# Patient Record
Sex: Female | Born: 1965 | Race: White | Hispanic: No | Marital: Married | Smoking: Former smoker
Health system: Southern US, Community
[De-identification: ages and names within clinical notes are randomized; demographics above are authoritative.]

## PROBLEM LIST (undated history)

## (undated) DIAGNOSIS — G2581 Restless legs syndrome: Secondary | ICD-10-CM

## (undated) DIAGNOSIS — N390 Urinary tract infection, site not specified: Secondary | ICD-10-CM

## (undated) DIAGNOSIS — F32A Depression, unspecified: Secondary | ICD-10-CM

## (undated) DIAGNOSIS — F329 Major depressive disorder, single episode, unspecified: Secondary | ICD-10-CM

## (undated) DIAGNOSIS — F419 Anxiety disorder, unspecified: Secondary | ICD-10-CM

## (undated) HISTORY — PX: LIPOSUCTION: SHX10

## (undated) HISTORY — DX: Urinary tract infection, site not specified: N39.0

---

## 1995-02-02 HISTORY — PX: TUBAL LIGATION: SHX77

## 2008-05-29 ENCOUNTER — Ambulatory Visit: Payer: Self-pay | Admitting: Gastroenterology

## 2008-09-25 ENCOUNTER — Emergency Department: Payer: Self-pay | Admitting: Unknown Physician Specialty

## 2010-03-19 ENCOUNTER — Ambulatory Visit: Payer: Self-pay | Admitting: General Surgery

## 2012-06-21 ENCOUNTER — Emergency Department: Payer: Self-pay | Admitting: Emergency Medicine

## 2013-09-17 ENCOUNTER — Emergency Department (HOSPITAL_COMMUNITY)
Admission: EM | Admit: 2013-09-17 | Discharge: 2013-09-17 | Disposition: A | Discharge status: Home / Self Care | Payer: 59 | Attending: Emergency Medicine | Admitting: Emergency Medicine

## 2013-09-17 ENCOUNTER — Encounter (HOSPITAL_COMMUNITY): Payer: Self-pay | Admitting: Emergency Medicine

## 2013-09-17 ENCOUNTER — Emergency Department (HOSPITAL_COMMUNITY): Payer: 59

## 2013-09-17 DIAGNOSIS — Z79899 Other long term (current) drug therapy: Secondary | ICD-10-CM | POA: Diagnosis not present

## 2013-09-17 DIAGNOSIS — F411 Generalized anxiety disorder: Secondary | ICD-10-CM | POA: Diagnosis not present

## 2013-09-17 DIAGNOSIS — R072 Precordial pain: Secondary | ICD-10-CM | POA: Diagnosis not present

## 2013-09-17 DIAGNOSIS — R110 Nausea: Secondary | ICD-10-CM | POA: Insufficient documentation

## 2013-09-17 DIAGNOSIS — E876 Hypokalemia: Secondary | ICD-10-CM | POA: Diagnosis not present

## 2013-09-17 DIAGNOSIS — R51 Headache: Secondary | ICD-10-CM | POA: Insufficient documentation

## 2013-09-17 DIAGNOSIS — R079 Chest pain, unspecified: Secondary | ICD-10-CM | POA: Diagnosis present

## 2013-09-17 DIAGNOSIS — G2581 Restless legs syndrome: Secondary | ICD-10-CM | POA: Diagnosis not present

## 2013-09-17 DIAGNOSIS — F329 Major depressive disorder, single episode, unspecified: Secondary | ICD-10-CM | POA: Insufficient documentation

## 2013-09-17 DIAGNOSIS — F3289 Other specified depressive episodes: Secondary | ICD-10-CM | POA: Insufficient documentation

## 2013-09-17 DIAGNOSIS — M546 Pain in thoracic spine: Secondary | ICD-10-CM | POA: Insufficient documentation

## 2013-09-17 HISTORY — DX: Depression, unspecified: F32.A

## 2013-09-17 HISTORY — DX: Major depressive disorder, single episode, unspecified: F32.9

## 2013-09-17 HISTORY — DX: Restless legs syndrome: G25.81

## 2013-09-17 HISTORY — DX: Anxiety disorder, unspecified: F41.9

## 2013-09-17 LAB — CBC WITH DIFFERENTIAL/PLATELET
Basophils Absolute: 0.1 10*3/uL (ref 0.0–0.1)
Basophils Relative: 1 % (ref 0–1)
EOS ABS: 0.3 10*3/uL (ref 0.0–0.7)
EOS PCT: 4 % (ref 0–5)
HEMATOCRIT: 36.1 % (ref 36.0–46.0)
Hemoglobin: 12.7 g/dL (ref 12.0–15.0)
LYMPHS ABS: 3.2 10*3/uL (ref 0.7–4.0)
LYMPHS PCT: 45 % (ref 12–46)
MCH: 30.9 pg (ref 26.0–34.0)
MCHC: 35.2 g/dL (ref 30.0–36.0)
MCV: 87.8 fL (ref 78.0–100.0)
MONO ABS: 0.4 10*3/uL (ref 0.1–1.0)
Monocytes Relative: 5 % (ref 3–12)
Neutro Abs: 3.3 10*3/uL (ref 1.7–7.7)
Neutrophils Relative %: 45 % (ref 43–77)
PLATELETS: 260 10*3/uL (ref 150–400)
RBC: 4.11 MIL/uL (ref 3.87–5.11)
RDW: 12.5 % (ref 11.5–15.5)
WBC: 7.2 10*3/uL (ref 4.0–10.5)

## 2013-09-17 LAB — BASIC METABOLIC PANEL
Anion gap: 13 (ref 5–15)
BUN: 13 mg/dL (ref 6–23)
CALCIUM: 9 mg/dL (ref 8.4–10.5)
CO2: 22 meq/L (ref 19–32)
Chloride: 103 mEq/L (ref 96–112)
Creatinine, Ser: 0.74 mg/dL (ref 0.50–1.10)
GFR calc Af Amer: >90 mL/min (ref >90)
GFR calc non Af Amer: >90 mL/min (ref >90)
GLUCOSE: 91 mg/dL (ref 70–99)
Potassium: 3.5 mEq/L — ABNORMAL LOW (ref 3.7–5.3)
Sodium: 138 mEq/L (ref 137–147)

## 2013-09-17 LAB — LIPASE, BLOOD: LIPASE: 26 U/L (ref 11–59)

## 2013-09-17 LAB — D-DIMER, QUANTITATIVE (NOT AT ARMC): D DIMER QUANT: 0.31 ug{FEU}/mL (ref 0.00–0.48)

## 2013-09-17 LAB — I-STAT TROPONIN, ED: Troponin i, poc: 0 ng/mL (ref 0.00–0.08)

## 2013-09-17 LAB — TROPONIN I: Troponin I: <0.3 ng/mL (ref <0.30)

## 2013-09-17 MED ORDER — MIRTAZAPINE 45 MG PO TABS
45.0000 mg | ORAL_TABLET | Freq: Every day | ORAL | Status: DC
  Administered 2013-09-17: 45 mg via ORAL
  Filled 2013-09-17: qty 1

## 2013-09-17 MED ORDER — GI COCKTAIL ~~LOC~~
30.0000 mL | Freq: Once | ORAL | Status: AC
  Administered 2013-09-17: 30 mL via ORAL
  Filled 2013-09-17: qty 30

## 2013-09-17 MED ORDER — POTASSIUM CHLORIDE CRYS ER 20 MEQ PO TBCR
40.0000 meq | EXTENDED_RELEASE_TABLET | Freq: Once | ORAL | Status: AC
  Administered 2013-09-17: 40 meq via ORAL
  Filled 2013-09-17: qty 2

## 2013-09-17 MED ORDER — IBUPROFEN 800 MG PO TABS
800.0000 mg | ORAL_TABLET | Freq: Once | ORAL | Status: AC
  Administered 2013-09-17: 800 mg via ORAL
  Filled 2013-09-17: qty 1

## 2013-09-17 NOTE — ED Notes (Signed)
Walked into patient room and patient had taken off EKG monitor, BP, and pulse ox and got dressed into her own clothes.

## 2013-09-17 NOTE — ED Notes (Addendum)
Pt. Has hematoma on left hand from EMS IV stick. Ice pack applied.

## 2013-09-17 NOTE — ED Provider Notes (Signed)
CSN: [id number]     Arrival date & time 09/17/13  1524 History   First MD Initiated Contact with Patient 09/17/13 1534     Chief Complaint  Patient presents with  . Chest Pain   Patient is a 48 y.o. female presenting with chest pain. The history is provided by the patient. No language interpreter was used.  Chest Pain Pain location:  Substernal area Pain quality: pressure   Pain radiates to:  Mid back Pain radiates to the back: yes   Pain severity:  Moderate Onset quality:  Sudden Duration:  30 minutes Timing:  Constant (followed by intermittent pressure for seconds which was not as bad as the initial pain) Progression:  Partially resolved Chronicity:  New Context: at rest and stress   Context: not breathing, no drug use, not eating, not lifting, no movement, not raising an arm and no trauma   Relieved by:  Aspirin and nitroglycerin Worsened by:  Nothing tried Ineffective treatments:  None tried Associated symptoms: anxiety, back pain, fatigue, headache and nausea   Associated symptoms: no abdominal pain, no altered mental status, no anorexia, no claudication, no cough, no diaphoresis, no dizziness, no dysphagia, no fever, no heartburn, no lower extremity edema, no near-syncope, no numbness, no orthopnea, no palpitations, no PND, no shortness of breath, no syncope, not vomiting and no weakness   Risk factors: no aortic disease, no birth control, no coronary artery disease, no diabetes mellitus, no high cholesterol, no hypertension, no immobilization, not obese, not pregnant, no prior DVT/PE, no smoking and no surgery     Past Medical History  Diagnosis Date  . Anxiety   . Depression   . Restless leg syndrome    Past Surgical History  Procedure Laterality Date  . Cesarean section    . Tubal ligation     No family history on file. History  Substance Use Topics  . Smoking status: Never Smoker   . Smokeless tobacco: Not on file  . Alcohol Use: Yes     Comment: occasionally    OB History   Grav Para Term Preterm Abortions TAB SAB Ect Mult Living                 Review of Systems  Constitutional: Positive for fatigue. Negative for fever and diaphoresis.  HENT: Negative for trouble swallowing.   Respiratory: Negative for cough and shortness of breath.   Cardiovascular: Positive for chest pain. Negative for palpitations, orthopnea, claudication, syncope, PND and near-syncope.  Gastrointestinal: Positive for nausea. Negative for heartburn, vomiting, abdominal pain and anorexia.  Musculoskeletal: Positive for back pain.  Neurological: Positive for headaches. Negative for dizziness, weakness and numbness.  All other systems reviewed and are negative.     Allergies  Macrobid; Neomycin; and Polymyxin b  Home Medications   Prior to Admission medications   Medication Sig Start Date End Date Taking? Authorizing Provider  clonazePAM (KLONOPIN) 0.5 MG tablet Take 0.5 mg by mouth 3 (three) times daily as needed for anxiety.   Yes Historical Provider, MD  gabapentin (NEURONTIN) 300 MG capsule Take 300 mg by mouth 2 (two) times daily as needed (nerve pain).   Yes Historical Provider, MD  ibuprofen (ADVIL,MOTRIN) 200 MG tablet Take 200 mg by mouth daily as needed for headache.   Yes Historical Provider, MD  mirtazapine (REMERON) 45 MG tablet Take 45 mg by mouth at bedtime.   Yes Historical Provider, MD  OVER THE COUNTER MEDICATION Place 1 drop into both eyes daily. Allergy  eye drops   Yes Historical Provider, MD   BP 114/62  Pulse 90  Resp 25  SpO2 98%  LMP 08/24/2013 Physical Exam  Nursing note and vitals reviewed. Constitutional: She is oriented to person, place, and time. She appears well-developed and well-nourished. No distress.  HENT:  Head: Normocephalic and atraumatic.  Mouth/Throat: Oropharynx is clear and moist. No oropharyngeal exudate.  Eyes: Conjunctivae and EOM are normal. Pupils are equal, round, and reactive to light. No scleral icterus.   Neck: Normal range of motion. Neck supple. No JVD present. No thyromegaly present.  Cardiovascular: Normal rate, regular rhythm, normal heart sounds and intact distal pulses.  Exam reveals no gallop and no friction rub.   No murmur heard. Pulmonary/Chest: Effort normal and breath sounds normal. No respiratory distress. She has no wheezes. She has no rales. She exhibits no tenderness.  Abdominal: Soft. Bowel sounds are normal. She exhibits no distension and no mass. There is no tenderness. There is no rebound and no guarding.  Musculoskeletal: Normal range of motion.  Lymphadenopathy:    She has no cervical adenopathy.  Neurological: She is alert and oriented to person, place, and time. No cranial nerve deficit. Coordination normal.  Skin: Skin is warm and dry. She is not diaphoretic.  Psychiatric: She has a normal mood and affect. Her behavior is normal. Judgment and thought content normal.    ED Course  Procedures (including critical care time) Labs Review Labs Reviewed  BASIC METABOLIC PANEL - Abnormal; Notable for the following:    Potassium 3.5 (*)    All other components within normal limits  CBC WITH DIFFERENTIAL  D-DIMER, QUANTITATIVE  LIPASE, BLOOD  TROPONIN I  Rosezena Sensor, ED    Imaging Review Dg Chest 2 View  09/17/2013   CLINICAL DATA:  Chest pain for 1 day.  Initial encounter.  EXAM: CHEST  2 VIEW  COMPARISON:  None.  FINDINGS: Cardiopericardial silhouette within normal limits. Mediastinal contours normal. Trachea midline. No airspace disease or effusion.  IMPRESSION: No active cardiopulmonary disease.   Electronically Signed   By: Andreas Newport M.D.   On: 09/17/2013 18:54     EKG Interpretation   Date/Time:  Monday September 17 2013 15:42:27 EDT Ventricular Rate:  90 PR Interval:  124 QRS Duration: 81 QT Interval:  352 QTC Calculation: 431 R Axis:   77 Text Interpretation:  Age not entered, assumed to be  48 years old for  purpose of ECG interpretation  Sinus rhythm LAE, consider biatrial  enlargement No old tracing to compare Confirmed by KNAPP  MD-I, IVA  ([id number]) on 09/17/2013 3:52:52 PM      MDM   Final diagnoses:  Chest pain, unspecified chest pain type  Hypokalemia   Patient is a 48 y.o. Female with 1 episode of 30 minute chest pain which is new onset.  EKG here shows some LVH with possible biatrial enlargement.  We do not have any previous EKGs to compare to.  CBC, D-dimer, CXR, troponin and delta troponin are negative here at this time.  BMP shows mild hypokalemia which was replaced here in the ED.  Patient has a HEART score of 3 and is low risk.  Patient has been treated here with a GI cocktail with some minor relief of symptoms.  Patient is stable for discharge at this time.  Will have the patient follow-up closely with her PCP Dr. Dossie Arbour and we discussed the possible need for an outpatient stress test.  Patient was told to return  to the ED with worsening shortness of breath and ACS symptoms.  She states understanding and agreement.  Patient was seen by and discussed with Dr. Loretha Stapler who agrees with the above treatment and plan.     Eben Burow, PA-C 09/17/13 2049  Eben Burow, PA-C 09/18/13 0045

## 2013-09-17 NOTE — ED Notes (Signed)
[facility]. [facility] States is having intermittent chest pressure. States "It's not as bad as it was, but I can feel it".

## 2013-09-17 NOTE — ED Notes (Signed)
Pt. Ambulated with steady gait to restroom.  

## 2013-09-17 NOTE — ED Provider Notes (Signed)
Medical screening examination/treatment/procedure(s) were conducted as a shared visit with non-physician practitioner(s) and myself.  I personally evaluated the patient during the encounter.   EKG Interpretation   Date/Time:  Monday September 17 2013 15:42:27 EDT Ventricular Rate:  90 PR Interval:  124 QRS Duration: 81 QT Interval:  352 QTC Calculation: 431 R Axis:   77 Text Interpretation:  Age not entered, assumed to be  48 years old for  purpose of ECG interpretation Sinus rhythm LAE, consider biatrial  enlargement No old tracing to compare Confirmed by KNAPP  MD-I, IVA  ([id number]) on 09/17/2013 3:52:52 PM      48 yo female with CP.  On exam, well appearing, nontoxic, not distressed, normal respiratory effort, normal perfusion, heart sounds normal with RRR, lungs CTAB, pulses 2+ and equal in BUE, BPs consistent in BUE.  Labwork, CXR, EKG normal.  Treated for GI chest pain.    Clinical Impression: 1. Chest pain, unspecified chest pain type   2. Hypokalemia       Candyce ChurnJohn David Dolph Tavano III, MD 09/18/13 [phone]

## 2013-09-17 NOTE — ED Notes (Signed)
Pt. Alert and oriented x4. Remeron to be at bedtime. Pt. Denies chest pain. Stable upon discharge. Denies further needs.

## 2013-09-17 NOTE — Discharge Instructions (Signed)

## 2013-09-17 NOTE — ED Notes (Signed)
Per EMS, patient c/o chest pain while at rest at work,  Radiation to left arm and back. Given 324 ASA, 4 mg zofran, and 1 nitro which relieved pain. Hx of anxiety, patient tearful on arrival. Family hx of early MI, pt mother had CABG at age 49.

## 2013-09-18 NOTE — ED Provider Notes (Signed)
Medical screening examination/treatment/procedure(s) were conducted as a shared visit with non-physician practitioner(s) and myself.  I personally evaluated the patient during the encounter.   EKG Interpretation   Date/Time:  Monday September 17 2013 15:42:27 EDT Ventricular Rate:  90 PR Interval:  124 QRS Duration: 81 QT Interval:  352 QTC Calculation: 431 R Axis:   77 Text Interpretation:  Age not entered, assumed to be  48 years old for  purpose of ECG interpretation Sinus rhythm LAE, consider biatrial  enlargement No old tracing to compare Confirmed by KNAPP  MD-I, IVA  ([id number]) on 09/17/2013 3:52:52 PM        [name], MD 09/18/13 [phone]

## 2014-04-18 ENCOUNTER — Other Ambulatory Visit: Payer: Self-pay | Admitting: Obstetrics & Gynecology

## 2014-04-18 DIAGNOSIS — R922 Inconclusive mammogram: Secondary | ICD-10-CM

## 2014-04-18 DIAGNOSIS — N6019 Diffuse cystic mastopathy of unspecified breast: Secondary | ICD-10-CM

## 2014-07-04 ENCOUNTER — Other Ambulatory Visit: Payer: Self-pay | Admitting: Family Medicine

## 2014-07-04 ENCOUNTER — Telehealth: Payer: Self-pay

## 2014-07-04 MED ORDER — GABAPENTIN 300 MG PO CAPS: 300.0000 mg | ORAL_CAPSULE | Freq: Two times a day (BID) | ORAL | Status: DC

## 2014-07-04 MED ORDER — MIRTAZAPINE 45 MG PO TABS: 45.0000 mg | ORAL_TABLET | Freq: Every day | ORAL | Status: DC

## 2014-07-04 NOTE — Telephone Encounter (Signed)
Notes from practice partner reviewed. I will continue her medication for 1 month- but we do need to get the records from the psychiatrist, which we haven't gotten yet.

## 2014-07-04 NOTE — Telephone Encounter (Signed)
No answer, will try to call patient again.

## 2014-07-04 NOTE — Telephone Encounter (Signed)
Patient called, she would like a refill on her medications that she gets from psych. She states that he (psych) does not help her with her conditions all he does is prescribe medications, she does not want to see him anymore. She states that she will continue to see her therapist.

## 2014-07-05 ENCOUNTER — Other Ambulatory Visit: Payer: Self-pay | Admitting: Family Medicine

## 2014-07-05 MED ORDER — TRAZODONE HCL 50 MG PO TABS: 50.0000 mg | ORAL_TABLET | Freq: Every day | ORAL | Status: DC

## 2014-07-05 NOTE — Telephone Encounter (Signed)
Discussed with patient that I will not give her that much klonopin. That if she needs that much, she would need to be under the care of a psychiatrist and that I don't prescribe that medicine without a preventative medicine. She states that she is taking her trazodone and needs a rx for that. Rx for mirtazapine, gabapentin and trazodone sent to her pharmacy today. Discussed with patient that I would be happy to wean her off the klonopin, or get her a referral to a new psychiatrist, but that I will not be able to continue giving her TID klonopin.

## 2014-07-05 NOTE — Telephone Encounter (Signed)
Patient called, notified her of Dr.Johnson's message. [facility] [address] [facility] [address], and [facility]. She states that she needs her Klonopin, mirtazapine, and trazodone. I let her speak with Dr. Laural BenesJohnson, she had several questions.

## 2014-07-11 ENCOUNTER — Telehealth: Payer: Self-pay | Admitting: Psychiatry

## 2014-07-11 NOTE — Telephone Encounter (Signed)
Received a call from Anda Latina from [facility] on 07/10/14. She indicated she was the medical student caring for the patient. I informed her that I would need a release of information before I could discuss any issues about patient. We received a fax signed release of information. I contacted Miss Dangler today. She informed me that the patient was admitted there for suicidal ideation in the context of cocaine use. I provided the list of medications and changes that the patient received under my care. According to Ms. Dangler patient informed her treatment team that she thought she had depression but was not clear whether she had been formally diagnosed by anyone. I did indicate that I had diagnosed the patient with depression.

## 2014-07-12 ENCOUNTER — Ambulatory Visit: Payer: Self-pay | Admitting: Psychiatry

## 2014-07-15 ENCOUNTER — Other Ambulatory Visit: Payer: Self-pay | Admitting: Family Medicine

## 2014-07-19 ENCOUNTER — Telehealth: Payer: Self-pay | Admitting: Family Medicine

## 2014-07-19 ENCOUNTER — Encounter: Payer: Self-pay | Admitting: Family Medicine

## 2014-07-19 ENCOUNTER — Ambulatory Visit (INDEPENDENT_AMBULATORY_CARE_PROVIDER_SITE_OTHER): Payer: 59 | Admitting: Family Medicine

## 2014-07-19 VITALS — BP 93/60 | HR 91 | Temp 97.6°F | Resp 18 | Ht 61.5 in | Wt 106.1 lb

## 2014-07-19 DIAGNOSIS — F411 Generalized anxiety disorder: Secondary | ICD-10-CM | POA: Insufficient documentation

## 2014-07-19 DIAGNOSIS — R45851 Suicidal ideations: Secondary | ICD-10-CM | POA: Diagnosis not present

## 2014-07-19 DIAGNOSIS — F1410 Cocaine abuse, uncomplicated: Secondary | ICD-10-CM | POA: Diagnosis not present

## 2014-07-19 DIAGNOSIS — F332 Major depressive disorder, recurrent severe without psychotic features: Secondary | ICD-10-CM

## 2014-07-19 NOTE — Patient Instructions (Signed)
Stimulant Use Disorder-Cocaine °Cocaine is one of a group of powerful drugs called stimulants. Cocaine has medical uses for stopping nosebleeds and for pain control before minor nose or dental surgery. However, cocaine is misused because of the effects that it produces. These effects include:  °· A feeling of extreme pleasure. °· Alertness. °· High energy. °Common street names for cocaine include coke, crack, blow, snow, and nose candy. Cocaine is snorted, dissolved in water and injected, or smoked.  °Stimulants are addictive because they activate regions of the brain that produce both the pleasurable sensation of "reward" and psychological dependence. Together, these actions account for loss of control and the rapid development of drug dependence. This means you become ill without the drug (withdrawal) and need to keep using it to function.  °Stimulant use disorder is use of stimulants that disrupts your daily life. It disrupts relationships with family and friends and how you do your job. Cocaine increases your blood pressure and heart rate. It can cause a heart attack or stroke. Cocaine can also cause death from irregular heart rate or seizures. °SYMPTOMS °Symptoms of stimulant use disorder with cocaine include: °· Use of cocaine in larger amounts or over a longer period of time than intended. °· Unsuccessful attempts to cut down or control cocaine use. °· A lot of time spent obtaining, using, or recovering from the effects of cocaine. °· A strong desire or urge to use cocaine (craving). °· Continued use of cocaine in spite of major problems at work, school, or home because of use. °· Continued use of cocaine in spite of relationship problems because of use. °· Giving up or cutting down on important life activities because of cocaine use. °· Use of cocaine over and over in situations when it is physically hazardous, such as driving a car. °· Continued use of cocaine in spite of a physical problem that is likely  related to use. Physical problems can include: °¨ Malnutrition. °¨ Nosebleeds. °¨ Chest pain. °¨ High blood pressure. °¨ A hole that develops between the part of your nose that separates your nostrils (perforated nasal septum). °¨ Lung and kidney damage. °· Continued use of cocaine in spite of a mental problem that is likely related to use. Mental problems can include: °¨ Schizophrenia-like symptoms. °¨ Depression. °¨ Bipolar mood swings. °¨ Anxiety. °¨ Sleep problems. °· Need to use more and more cocaine to get the same effect, or lessened effect over time with use of the same amount of cocaine (tolerance). °· Having withdrawal symptoms when cocaine use is stopped, or using cocaine to reduce or avoid withdrawal symptoms. Withdrawal symptoms include: °¨ Depressed or irritable mood. °¨ Low energy or restlessness. °¨ Bad dreams. °¨ Poor or excessive sleep. °¨ Increased appetite. °DIAGNOSIS °Stimulant use disorder is diagnosed by your health care provider. You may be asked questions about your cocaine use and how it affects your life. A physical exam may be done. A drug screen may be ordered. You may be referred to a mental health professional. The diagnosis of stimulant use disorder requires at least two symptoms within 12 months. The type of stimulant use disorder depends on the number of signs and symptoms you have. The type may be: °· Mild. Two or three signs and symptoms. °· Moderate. Four or five signs and symptoms. °· Severe. Six or more signs and symptoms. °TREATMENT °Treatment for stimulant use disorder is usually provided by mental health professionals with training in substance use disorders. The following options are available: °·   Counseling or talk therapy. Talk therapy addresses the reasons you use cocaine and ways to keep you from using again. Goals of talk therapy include: °¨ Identifying and avoiding triggers for use. °¨ Handling cravings. °¨ Replacing use with healthy activities. °· Support groups.  Support groups provide emotional support, advice, and guidance. °· Medicine. Certain medicines may decrease cocaine cravings or withdrawal symptoms. °HOME CARE INSTRUCTIONS °· Take medicines only as directed by your health care provider. °· Identify the people and activities that trigger your cocaine use and avoid them. °· Keep all follow-up visits as directed by your health care provider. °SEEK MEDICAL CARE IF: °· Your symptoms get worse or you relapse. °· You are not able to take medicines as directed. °SEEK IMMEDIATE MEDICAL CARE IF: °· You have serious thoughts about hurting yourself or others. °· You have a seizure, chest pain, sudden weakness, or loss of speech or vision. °FOR MORE INFORMATION °· National Institute on Drug Abuse: www.drugabuse.gov °· Substance Abuse and Mental Health Services Administration: www.samhsa.gov °Document Released: 01/16/2000 Document Revised: 06/04/2013 Document Reviewed: 01/31/2013 °ExitCare® Patient Information ©2015 [employer]. This information is not intended to replace advice given to you by your health care provider. Make sure you discuss any questions you have with your health care provider. ° °

## 2014-07-19 NOTE — Telephone Encounter (Signed)
Called to let patient know that she can pick up her paperwork for temporary disability. It is waiting for her in the front office.

## 2014-07-19 NOTE — Progress Notes (Signed)
BP 93/60 mmHg  Pulse 91  Temp(Src) 97.6 F (36.4 C)  Resp 18  Ht 5' 1.5" (1.562 m)  Wt 106 lb 1.6 oz (48.127 kg)  BMI 19.73 kg/m2  SpO2 97%  LMP 06/23/2014 (Exact Date)   Subjective:    Patient ID: Wanda Price, female    DOB: [birth year], 49 y.o.   MRN: [id number]  HPI: Wanda Price is a 49 y.o. female  Chief Complaint  Patient presents with  . Anxiety    Patient states that she currently going through a withdrawal from her Klonopin, states that she was recently hospitalized.   HOSPITAL FOLLOW UP Hospitalized 07/07/14-07/15/14 Time since discharge: 4 days Hospital/facility: [facility] Diagnosis: Suicidal ideation, cocaine abuse, depression, anxiety, marijuana use Consultants: Psychiatry New medications: hydroxyzine, gabapentin, clorazepate, ferrous sulfate Discharge instructions: Rehab, wean off the benzos, follow up with psych   Per her records from the hospital, Wanda Price presented to [facility] with increased use of cocaine (1-2g/week for the past 1-2 months), passive suicidal ideation and depression and anxiety. She had been using cocaine for about a year in association with her divorce.    She states that today she is doing better. She has been weaning off the klonopin and feels more like herself again. She has been clean from the cocaine and marijuana for 13 days! She has an appointment with a new psychiatrist for 08/06/14. She is trying to get her life back together and has a plan. She is going to AA/NA and just needs to find one closer to home. She is otherwise feeling well and has no other concerns or complaints at this time. She does need some paperwork filled out as she was out of work las week due to her hospitalization.   Relevant past medical, surgical, family and social history reviewed and updated as indicated. Interim medical history since our st visit reviewed. Allergies and medications reviewed and updated.  Review of Systems  Constitutional: Negative.    Respiratory: Negative.   Cardiovascular: Negative.   Psychiatric/Behavioral: Positive for sleep disturbance and decreased concentration. Negative for suicidal ideas, hallucinations, behavioral problems, confusion, self-injury, dysphoric mood and agitation. The patient is nervous/anxious. The patient is not hyperactive.     Per HPI unless specifically indicated above     Objective:    BP 93/60 mmHg  Pulse 91  Temp(Src) 97.6 F (36.4 C)  Resp 18  Ht 5' 1.5" (1.562 m)  Wt 106 lb 1.6 oz (48.127 kg)  BMI 19.73 kg/m2  SpO2 97%  LMP 06/23/2014 (Exact Date)  Wt Readings from Last 3 Encounters:  07/19/14 106 lb 1.6 oz (48.127 kg)  05/07/14 106 lb (48.081 kg)    Physical Exam  Constitutional: She is oriented to person, place, and time. She appears well-developed and well-nourished. No distress.  HENT:  Head: Normocephalic and atraumatic.  Right Ear: Hearing normal.  Left Ear: Hearing normal.  Nose: Nose normal.  Eyes: Conjunctivae and lids are normal. Right eye exhibits no discharge. Left eye exhibits no discharge. No scleral icterus.  Cardiovascular: Normal rate, regular rhythm and normal heart sounds.  Exam reveals no gallop and no friction rub.   No murmur heard. Pulmonary/Chest: Effort normal and breath sounds normal. No respiratory distress. She has no wheezes. She has no rales. She exhibits no tenderness.  Musculoskeletal: Normal range of motion.  Neurological: She is alert and oriented to person, place, and time.  Skin: Skin is warm, dry and intact. No rash noted.  Psychiatric: She has a  normal mood and affect. Her speech is normal and behavior is normal. Judgment and thought content normal. Cognition and memory are normal.  Nursing note and vitals reviewed.     Assessment & Plan:   Problem List Items Addressed This Visit      Other   Recurrent major depression-severe - Primary    Doing much better, weaning off her klonopin with the help of psych at [facility]. Follow up with  psychiatry 08/06/14. Make appointment with counselor ASAP. NA/AA meetings as needed. Continue current regimen. Out of work until she sees her new psychiatrist. Form filled out today and faxed to the [employer]. To be scanned in and she can pick it up.       Relevant Medications   clorazepate (TRANXENE) 7.5 MG tablet   hydrOXYzine (ATARAX/VISTARIL) 25 MG tablet   Generalized anxiety disorder    Doing much better, weaning off her klonopin with the help of psych at [facility]. Follow up with psychiatry 08/06/14. Make appointment with counselor ASAP. NA/AA meetings as needed.       Cocaine abuse    Clean x13 days, going to AA/NA, going to see her counsellor. No cravings at this time.        Other Visit Diagnoses    Passive suicidal ideations        Resolved now following hospitalization at [facility] 6/5-13/16        Follow up plan: No Follow-up on file.

## 2014-07-19 NOTE — Assessment & Plan Note (Addendum)
Doing much better, weaning off her klonopin with the help of psych at [facility]. Follow up with psychiatry 08/06/14. Make appointment with counselor ASAP. NA/AA meetings as needed. Continue current regimen. Out of work until she sees her new psychiatrist. Form filled out today and faxed to the [employer]. To be scanned in and she can pick it up.

## 2014-07-19 NOTE — Assessment & Plan Note (Signed)
Clean x13 days, going to AA/NA, going to see her counsellor. No cravings at this time.

## 2014-07-19 NOTE — Assessment & Plan Note (Signed)
Doing much better, weaning off her klonopin with the help of psych at [facility]. Follow up with psychiatry 08/06/14. Make appointment with counselor ASAP. NA/AA meetings as needed.

## 2014-07-30 NOTE — Telephone Encounter (Signed)
She notes that her anxiety is getting worse. She is working on her breathing. She has been taking her hydroxyzine every 5.5 hours instead of every 6. Her RLS is also really acting up. Will take gabapentin TID instead of just at night. To be seeing psychiatry on Tuesday. Does not need a refill on her medicine so not sent in.

## 2014-07-30 NOTE — Telephone Encounter (Signed)
Pt would like to talk to you about her anxiety, having to take extra meds for her RLS.  Would like a suggestion.

## 2014-08-07 ENCOUNTER — Encounter: Payer: Self-pay | Admitting: Family Medicine

## 2014-08-07 ENCOUNTER — Ambulatory Visit (INDEPENDENT_AMBULATORY_CARE_PROVIDER_SITE_OTHER): Payer: 59 | Admitting: Family Medicine

## 2014-08-07 VITALS — BP 113/80 | HR 80 | Temp 98.2°F | Wt 105.1 lb

## 2014-08-07 DIAGNOSIS — F332 Major depressive disorder, recurrent severe without psychotic features: Secondary | ICD-10-CM | POA: Diagnosis not present

## 2014-08-07 DIAGNOSIS — F411 Generalized anxiety disorder: Secondary | ICD-10-CM

## 2014-08-07 DIAGNOSIS — F1410 Cocaine abuse, uncomplicated: Secondary | ICD-10-CM

## 2014-08-07 NOTE — Assessment & Plan Note (Signed)
Continue to follow with psychiatry. Appreciate their input and agree with genomic testing. Advised patient not to come off her remeron without talking with them first and to work with them on medication management. Will follow up with patient in 3 weeks, and will keep her out of work until August 1. Advised her to have work send over the paperwork and we will fill it out.

## 2014-08-07 NOTE — Assessment & Plan Note (Signed)
Clean x32 days, going to AA/NA, going to see her counsellor. No cravings at this time.

## 2014-08-07 NOTE — Assessment & Plan Note (Signed)
Continue to follow with psychiatry. Continue to monitor.

## 2014-08-07 NOTE — Progress Notes (Signed)
BP 113/80 mmHg  Pulse 80  Temp(Src) 98.2 F (36.8 C)  Wt 105 lb 1.6 oz (47.673 kg)  SpO2 99%  LMP 07/30/2014 (Exact Date)   Subjective:    Patient ID: Wanda Price, female    DOB: [birth year], 49 y.o.   MRN: [id number]  HPI: Wanda Beltsabatha D Sween is a 49 y.o. female  Chief Complaint  Patient presents with  . Depression  . Restless leg syndrome   Likes her psychiatrist. Doing genomic testing for depression.   Didn't take her remeron for 2 nights and didn't have a problem with her RLS- wondering if the remeron is the cause of the RLS.   Started on seroquel for short term. Also started buspar- feels like both of them are helping her. She notes that her psychiatrist feels like it is more anxiety than depression that is the main issue right now. They are going to work on her insomnia.   Seeing her in 2 weeks. Thinks that she might be allergic to wellbutrin.   Doesn't think she should go back to work right now. Case manager notes that if she is not ready to go back to work, then she shouldn't. Wants to see psychiatry one more time before she goes back. Seeing her on the 20th. She would like to stay out until the 1st to make sure she is table before she goes back.   Has remained clean. No urges. Not seeing that group of friends anymore. No other concerns or complaints at this time.   Relevant past medical, surgical, family and social history reviewed and updated as indicated. Interim medical history since our last visit reviewed. Allergies and medications reviewed and updated.  Review of Systems  Constitutional: Negative.   Respiratory: Negative.   Cardiovascular: Negative.   Psychiatric/Behavioral: Negative.     Per HPI unless specifically indicated above     Objective:    BP 113/80 mmHg  Pulse 80  Temp(Src) 98.2 F (36.8 C)  Wt 105 lb 1.6 oz (47.673 kg)  SpO2 99%  LMP 07/30/2014 (Exact Date)  Wt Readings from Last 3 Encounters:  08/07/14 105 lb 1.6 oz (47.673 kg)   07/19/14 106 lb 1.6 oz (48.127 kg)  05/07/14 106 lb (48.081 kg)    Physical Exam  Constitutional: She is oriented to person, place, and time. She appears well-developed and well-nourished. No distress.  HENT:  Head: Normocephalic and atraumatic.  Right Ear: Hearing normal.  Left Ear: Hearing normal.  Nose: Nose normal.  Eyes: Conjunctivae and lids are normal. Right eye exhibits no discharge. Left eye exhibits no discharge. No scleral icterus.  Pulmonary/Chest: Effort normal. No respiratory distress.  Musculoskeletal: Normal range of motion.  Neurological: She is alert and oriented to person, place, and time.  Skin: Skin is intact. No rash noted.  Psychiatric: She has a normal mood and affect. Her speech is normal and behavior is normal. Judgment and thought content normal. Cognition and memory are normal.  Nursing note and vitals reviewed.       Assessment & Plan:   Problem List Items Addressed This Visit      Other   Recurrent major depression-severe - Primary    Continue to follow with psychiatry. Appreciate their input and agree with genomic testing. Advised patient not to come off her remeron without talking with them first and to work with them on medication management. Will follow up with patient in 3 weeks, and will keep her out of work until August 1.  Advised her to have work send over the paperwork and we will fill it out.       Relevant Medications   busPIRone (BUSPAR) 15 MG tablet   Generalized anxiety disorder    Continue to follow with psychiatry. Continue to monitor.       Cocaine abuse    Clean x32 days, going to AA/NA, going to see her counsellor. No cravings at this time.          Out of work until Aug 1   Follow up plan: Return in about 3 weeks (around 08/28/2014).

## 2014-08-08 ENCOUNTER — Telehealth: Payer: Self-pay | Admitting: Family Medicine

## 2014-08-08 NOTE — Telephone Encounter (Signed)
Pt's short term disability case worker and [employer] will be faxing paperwork to complete. Short term will be handled by the case worker, [employer] handles long term. Pt just wanted to let you know to expect this information.   Long term claim # [id number]  [employer]'s fax # [phone]  Attn: Jarrett Sohoiffany   Long term starts on 714/16.

## 2014-08-12 ENCOUNTER — Telehealth: Payer: Self-pay

## 2014-08-12 NOTE — Telephone Encounter (Signed)
Received a fax from [employer], I spoke with Dr.Johnson we do not work on Long Term disability in our office. I tried to notified patient that she will have to get her psychologist to fill them out, but there was not a answer and unable to leave a message. Will try again.

## 2014-08-15 NOTE — Telephone Encounter (Signed)
Left voicemail to notify patient that we do not take care these.

## 2014-08-22 ENCOUNTER — Other Ambulatory Visit: Payer: Self-pay | Admitting: Family Medicine

## 2014-08-23 ENCOUNTER — Other Ambulatory Visit: Payer: Self-pay | Admitting: Family Medicine

## 2014-08-27 ENCOUNTER — Ambulatory Visit: Payer: 59 | Admitting: Family Medicine

## 2016-11-30 LAB — HM MAMMOGRAPHY

## 2018-10-24 ENCOUNTER — Encounter: Payer: Self-pay | Admitting: Family Medicine

## 2021-01-03 ENCOUNTER — Encounter: Payer: Self-pay | Admitting: Emergency Medicine

## 2021-01-03 ENCOUNTER — Emergency Department: Payer: 59

## 2021-01-03 ENCOUNTER — Inpatient Hospital Stay
Admission: EM | Admit: 2021-01-03 | Discharge: 2021-01-07 | DRG: 517 | Disposition: A | Discharge status: Home Health | Payer: 59 | Attending: Student in an Organized Health Care Education/Training Program | Admitting: Student in an Organized Health Care Education/Training Program

## 2021-01-03 ENCOUNTER — Other Ambulatory Visit: Payer: Self-pay

## 2021-01-03 DIAGNOSIS — Y92511 Restaurant or cafe as the place of occurrence of the external cause: Secondary | ICD-10-CM | POA: Diagnosis not present

## 2021-01-03 DIAGNOSIS — W1830XA Fall on same level, unspecified, initial encounter: Secondary | ICD-10-CM | POA: Diagnosis present

## 2021-01-03 DIAGNOSIS — K219 Gastro-esophageal reflux disease without esophagitis: Secondary | ICD-10-CM | POA: Diagnosis present

## 2021-01-03 DIAGNOSIS — F32A Depression, unspecified: Secondary | ICD-10-CM

## 2021-01-03 DIAGNOSIS — W19XXXA Unspecified fall, initial encounter: Secondary | ICD-10-CM | POA: Diagnosis not present

## 2021-01-03 DIAGNOSIS — Z885 Allergy status to narcotic agent status: Secondary | ICD-10-CM | POA: Diagnosis not present

## 2021-01-03 DIAGNOSIS — Z87891 Personal history of nicotine dependence: Secondary | ICD-10-CM | POA: Diagnosis not present

## 2021-01-03 DIAGNOSIS — Z79899 Other long term (current) drug therapy: Secondary | ICD-10-CM

## 2021-01-03 DIAGNOSIS — Z800 Family history of malignant neoplasm of digestive organs: Secondary | ICD-10-CM | POA: Diagnosis not present

## 2021-01-03 DIAGNOSIS — Z9221 Personal history of antineoplastic chemotherapy: Secondary | ICD-10-CM | POA: Diagnosis not present

## 2021-01-03 DIAGNOSIS — F419 Anxiety disorder, unspecified: Secondary | ICD-10-CM

## 2021-01-03 DIAGNOSIS — Z853 Personal history of malignant neoplasm of breast: Secondary | ICD-10-CM

## 2021-01-03 DIAGNOSIS — S82001A Unspecified fracture of right patella, initial encounter for closed fracture: Secondary | ICD-10-CM | POA: Diagnosis present

## 2021-01-03 DIAGNOSIS — S82021A Displaced longitudinal fracture of right patella, initial encounter for closed fracture: Secondary | ICD-10-CM

## 2021-01-03 DIAGNOSIS — G2581 Restless legs syndrome: Secondary | ICD-10-CM | POA: Diagnosis present

## 2021-01-03 DIAGNOSIS — Z823 Family history of stroke: Secondary | ICD-10-CM | POA: Diagnosis not present

## 2021-01-03 DIAGNOSIS — F10920 Alcohol use, unspecified with intoxication, uncomplicated: Secondary | ICD-10-CM | POA: Diagnosis not present

## 2021-01-03 DIAGNOSIS — S76119A Strain of unspecified quadriceps muscle, fascia and tendon, initial encounter: Secondary | ICD-10-CM | POA: Diagnosis present

## 2021-01-03 DIAGNOSIS — S86812A Strain of other muscle(s) and tendon(s) at lower leg level, left leg, initial encounter: Secondary | ICD-10-CM | POA: Diagnosis not present

## 2021-01-03 DIAGNOSIS — T148XXA Other injury of unspecified body region, initial encounter: Secondary | ICD-10-CM

## 2021-01-03 DIAGNOSIS — Z923 Personal history of irradiation: Secondary | ICD-10-CM

## 2021-01-03 DIAGNOSIS — Z9013 Acquired absence of bilateral breasts and nipples: Secondary | ICD-10-CM

## 2021-01-03 DIAGNOSIS — Z20822 Contact with and (suspected) exposure to covid-19: Secondary | ICD-10-CM | POA: Diagnosis present

## 2021-01-03 DIAGNOSIS — Y908 Blood alcohol level of 240 mg/100 ml or more: Secondary | ICD-10-CM | POA: Diagnosis present

## 2021-01-03 DIAGNOSIS — Z8041 Family history of malignant neoplasm of ovary: Secondary | ICD-10-CM

## 2021-01-03 DIAGNOSIS — R110 Nausea: Secondary | ICD-10-CM | POA: Diagnosis not present

## 2021-01-03 DIAGNOSIS — F10220 Alcohol dependence with intoxication, uncomplicated: Secondary | ICD-10-CM | POA: Diagnosis present

## 2021-01-03 DIAGNOSIS — S82009A Unspecified fracture of unspecified patella, initial encounter for closed fracture: Secondary | ICD-10-CM

## 2021-01-03 DIAGNOSIS — S82031A Displaced transverse fracture of right patella, initial encounter for closed fracture: Principal | ICD-10-CM | POA: Diagnosis present

## 2021-01-03 DIAGNOSIS — Z808 Family history of malignant neoplasm of other organs or systems: Secondary | ICD-10-CM

## 2021-01-03 DIAGNOSIS — Z881 Allergy status to other antibiotic agents status: Secondary | ICD-10-CM

## 2021-01-03 DIAGNOSIS — Z888 Allergy status to other drugs, medicaments and biological substances status: Secondary | ICD-10-CM

## 2021-01-03 DIAGNOSIS — S82001S Unspecified fracture of right patella, sequela: Secondary | ICD-10-CM | POA: Diagnosis not present

## 2021-01-03 LAB — CBC
HCT: 35.5 % — ABNORMAL LOW (ref 36.0–46.0)
Hemoglobin: 12.2 g/dL (ref 12.0–15.0)
MCH: 32 pg (ref 26.0–34.0)
MCHC: 34.4 g/dL (ref 30.0–36.0)
MCV: 93.2 fL (ref 80.0–100.0)
Platelets: 360 10*3/uL (ref 150–400)
RBC: 3.81 MIL/uL — ABNORMAL LOW (ref 3.87–5.11)
RDW: 12.1 % (ref 11.5–15.5)
WBC: 9 10*3/uL (ref 4.0–10.5)
nRBC: 0 % (ref 0.0–0.2)

## 2021-01-03 LAB — COMPREHENSIVE METABOLIC PANEL
ALT: 119 U/L — ABNORMAL HIGH (ref 0–44)
AST: 95 U/L — ABNORMAL HIGH (ref 15–41)
Albumin: 4.2 g/dL (ref 3.5–5.0)
Alkaline Phosphatase: 318 U/L — ABNORMAL HIGH (ref 38–126)
Anion gap: 9 (ref 5–15)
BUN: 14 mg/dL (ref 6–20)
CO2: 25 mmol/L (ref 22–32)
Calcium: 9 mg/dL (ref 8.9–10.3)
Chloride: 99 mmol/L (ref 98–111)
Creatinine, Ser: 0.85 mg/dL (ref 0.44–1.00)
GFR, Estimated: >60 mL/min (ref >60)
Glucose, Bld: 106 mg/dL — ABNORMAL HIGH (ref 70–99)
Potassium: 3.7 mmol/L (ref 3.5–5.1)
Sodium: 133 mmol/L — ABNORMAL LOW (ref 135–145)
Total Bilirubin: 0.5 mg/dL (ref 0.3–1.2)
Total Protein: 7.1 g/dL (ref 6.5–8.1)

## 2021-01-03 LAB — TYPE AND SCREEN
ABO/RH(D): O POS
Antibody Screen: NEGATIVE

## 2021-01-03 LAB — ETHANOL: Alcohol, Ethyl (B): 308 mg/dL (ref <10)

## 2021-01-03 LAB — RESP PANEL BY RT-PCR (FLU A&B, COVID) ARPGX2
Influenza A by PCR: NEGATIVE
Influenza B by PCR: NEGATIVE
SARS Coronavirus 2 by RT PCR: NEGATIVE

## 2021-01-03 MED ORDER — ONDANSETRON HCL 4 MG PO TABS: 4.0000 mg | ORAL_TABLET | Freq: Four times a day (QID) | ORAL | Status: DC | PRN

## 2021-01-03 MED ORDER — TRAZODONE HCL 50 MG PO TABS
50.0000 mg | ORAL_TABLET | Freq: Every day | ORAL | Status: DC
  Administered 2021-01-03 – 2021-01-06 (×4): 50 mg via ORAL
  Filled 2021-01-03 (×4): qty 1

## 2021-01-03 MED ORDER — MAGNESIUM HYDROXIDE 400 MG/5ML PO SUSP
30.0000 mL | Freq: Every day | ORAL | Status: DC | PRN
  Administered 2021-01-06: 30 mL via ORAL
  Filled 2021-01-03: qty 30

## 2021-01-03 MED ORDER — ONDANSETRON HCL 4 MG/2ML IJ SOLN: 4.0000 mg | Freq: Four times a day (QID) | INTRAMUSCULAR | Status: DC | PRN

## 2021-01-03 MED ORDER — CITALOPRAM HYDROBROMIDE 20 MG PO TABS
40.0000 mg | ORAL_TABLET | Freq: Every day | ORAL | Status: DC
  Administered 2021-01-05: 20 mg via ORAL
  Administered 2021-01-06 – 2021-01-07 (×2): 40 mg via ORAL
  Filled 2021-01-03 (×4): qty 2

## 2021-01-03 MED ORDER — BUSPIRONE HCL 5 MG PO TABS: 15.0000 mg | ORAL_TABLET | Freq: Two times a day (BID) | ORAL | Status: DC

## 2021-01-03 MED ORDER — CYCLOBENZAPRINE HCL 10 MG PO TABS: 5.0000 mg | ORAL_TABLET | Freq: Three times a day (TID) | ORAL | Status: DC | PRN

## 2021-01-03 MED ORDER — SODIUM CHLORIDE 0.9 % IV SOLN: INTRAVENOUS | Status: DC

## 2021-01-03 MED ORDER — MIRTAZAPINE 15 MG PO TABS: 45.0000 mg | ORAL_TABLET | Freq: Every day | ORAL | Status: DC

## 2021-01-03 MED ORDER — ACETAMINOPHEN 325 MG PO TABS: 650.0000 mg | ORAL_TABLET | Freq: Four times a day (QID) | ORAL | Status: DC | PRN

## 2021-01-03 MED ORDER — SODIUM CHLORIDE 0.9 % IV SOLN
1000.0000 mL | Freq: Once | INTRAVENOUS | Status: AC
  Administered 2021-01-03: 1000 mL via INTRAVENOUS

## 2021-01-03 MED ORDER — GABAPENTIN 300 MG PO CAPS: 300.0000 mg | ORAL_CAPSULE | ORAL | Status: DC

## 2021-01-03 MED ORDER — ACETAMINOPHEN 650 MG RE SUPP: 650.0000 mg | Freq: Four times a day (QID) | RECTAL | Status: DC | PRN

## 2021-01-03 MED ORDER — TRAZODONE HCL 50 MG PO TABS: 25.0000 mg | ORAL_TABLET | Freq: Every evening | ORAL | Status: DC | PRN

## 2021-01-03 MED ORDER — FERROUS SULFATE 325 (65 FE) MG PO TABS: 325.0000 mg | ORAL_TABLET | Freq: Every day | ORAL | Status: DC

## 2021-01-03 MED ORDER — QUETIAPINE FUMARATE 25 MG PO TABS: 100.0000 mg | ORAL_TABLET | Freq: Every day | ORAL | Status: DC

## 2021-01-03 NOTE — ED Triage Notes (Signed)
Pt arrives via [facility], C/O fall landing on rt knee. Per EMS knee was sunken at first then popped out.  Pt give of fentanyl by EMS and now states pain of 4. Pt tearful and currently intoxicated d/t alcohol intake. Recent breast cancer survivor and liposuction surgery per EMS.

## 2021-01-03 NOTE — H&P (Signed)
[place]   PATIENT NAME: Wanda Price    MR#:  [id number]  DATE OF BIRTH:  11/17/1965  DATE OF ADMISSION:  01/03/2021  PRIMARY CARE PHYSICIAN: No primary care provider on file.   Patient is coming from: Home  REQUESTING/REFERRING PHYSICIAN: Lavonia Drafts, MD  CHIEF COMPLAINT:   Chief Complaint  Patient presents with   Fall    HISTORY OF PRESENT ILLNESS:  TED ALEJANDRO is a 55 y.o. Caucasian female with medical history significant for anxiety, depression and restless leg syndrome, as well as right breast cancer status post bilateral mastectomy, chemotherapy and radiotherapy, who presented to the ER with acute onset of mechanical fall landing on her right knee with subsequent severe right knee pain.  She stated that she slid on her knee.  She was given 75 mcg of IV fentanyl by EMS and was still having pain upon arrival to the ER.  She was noted to be alcohol intoxicated.  She admitted to drinking a bottle of wine at 5 PM.  She usually drinks that amount to 3 times a week.  She has been having mild cough without dyspnea or wheezing.  No nausea or vomiting or abdominal pain.  No chest pain or palpitations. No dysuria, oliguria or hematuria or flank pain.  ED Course: When she came to the ER blood pressure was 145/95 with a heart rate of 114 and otherwise normal vital signs.  Labs revealed unremarkable CBC and CMP is currently pending. EKG as reviewed by me : I ordered and is pending. Imaging: Right knee x-ray showed comminuted mid to lower patellar fracture with distraction of the fracture segment proximally by 5.3 cm.  The patient was given 1 L bolus of IV normal saline.  Dr. Mack Guise was notified about the patient and is planning for ORIF in a.m.  She will be admitted to a med-surgical bed for further management.  PAST MEDICAL HISTORY:   Past Medical History:  Diagnosis Date   Anxiety    Chronic UTI    Depression    Restless leg syndrome   History of breast  cancer status post bilateral mastectomy and radiotherapy.  She refused letrozole for side effects. PAST SURGICAL HISTORY:   Past Surgical History:  Procedure Laterality Date   CESAREAN SECTION     LIPOSUCTION     TUBAL LIGATION  02/02/1995  -Bilateral mastectomy on 05/07/2020 and breast reconstruction.  SOCIAL HISTORY:   Social History   Tobacco Use   Smoking status: Former   Smokeless tobacco: Never  Substance Use Topics   Alcohol use: Yes    Alcohol/week: 0.0 standard drinks    Comment: occasionally    FAMILY HISTORY:   Family History  Problem Relation Age of Onset   Skin cancer Mother    Colon cancer Mother    Ovarian cancer Maternal Grandmother    Stroke Mother     DRUG ALLERGIES:   Allergies  Allergen Reactions   Desvenlafaxine Shortness Of Breath   Abilify [Aripiprazole] Other (See Comments)    Facial Disturbance   Codeine    Macrobid [Nitrofurantoin Monohyd Macro] Other (See Comments)    Severe headache and nausea   Wellbutrin [Bupropion] Other (See Comments)    Mood Changes   Zoloft [Sertraline Hcl] Other (See Comments)    Wired, unable to sleep   Bacitracin Rash   Neomycin Rash   Polymyxin B Rash    REVIEW OF SYSTEMS:   ROS As per history of present  illness. All pertinent systems were reviewed above. Constitutional, HEENT, cardiovascular, respiratory, GI, GU, musculoskeletal, neuro, psychiatric, endocrine, integumentary and hematologic systems were reviewed and are otherwise negative/unremarkable except for positive findings mentioned above in the HPI.   MEDICATIONS AT HOME:   Prior to Admission medications   Medication Sig Start Date End Date Taking? Authorizing Provider  busPIRone (BUSPAR) 15 MG tablet Take 15 mg by mouth 2 (two) times daily.    [provider]  cyclobenzaprine (FLEXERIL) 5 MG tablet Take 5 mg by mouth 3 (three) times daily as needed. 12/22/20   [provider]  ferrous sulfate 325 (65 FE) MG tablet Take 325  mg by mouth daily. 07/15/14 07/15/15  [provider]  gabapentin (NEURONTIN) 100 MG capsule Take 100 mg by mouth 3 (three) times daily. 12/22/20   [provider]  gabapentin (NEURONTIN) 300 MG capsule Take 1 tab qAM, 1 tab qPM and 2 tabs qHS 07/30/14   Johnson, Megan P, DO  ibuprofen (ADVIL,MOTRIN) 200 MG tablet Take 200 mg by mouth daily as needed for headache.    [provider]  mirtazapine (REMERON) 45 MG tablet Take 1 tablet by mouth at  bedtime 07/30/14   Johnson, Megan P, DO  OVER THE COUNTER MEDICATION Place 1 drop into both eyes daily. Allergy eye drops    [provider]  oxyCODONE (OXY IR/ROXICODONE) 5 MG immediate release tablet Take by mouth. 12/22/20   [provider]  QUEtiapine (SEROQUEL) 100 MG tablet Take 100 mg by mouth at bedtime.    [provider]  traZODone (DESYREL) 50 MG tablet Take 1 tablet (50 mg total) by mouth at bedtime. 08/23/14   Gabriel Cirri, NP      VITAL SIGNS:  Blood pressure 128/86, pulse 100, temperature 98.1 F (36.7 C), temperature source Oral, resp. rate (!) 28, height 5' 1.5" (1.562 m), weight 54 kg, SpO2 97 %.  PHYSICAL EXAMINATION:  Physical Exam  GENERAL:  55 y.o.-year-old Caucasian female patient lying in the bed with no acute distress.  She was somnolent but arousable and cooperative. EYES: Pupils equal, round, reactive to light and accommodation. No scleral icterus. Extraocular muscles intact.  HEENT: Head atraumatic, normocephalic. Oropharynx and nasopharynx clear.  NECK:  Supple, no jugular venous distention. No thyroid enlargement, no tenderness.  LUNGS: Normal breath sounds bilaterally, no wheezing, rales,rhonchi or crepitation. No use of accessory muscles of respiration.  CARDIOVASCULAR: Regular rate and rhythm, S1, S2 normal. No murmurs, rubs, or gallops.  ABDOMEN: Soft, nondistended, nontender. Bowel sounds present. No organomegaly or mass.  EXTREMITIES: No pedal edema, cyanosis, or  clubbing. Musculoskeletal: Right knee in knee brace. NEUROLOGIC: Cranial nerves II through XII are intact. Muscle strength 5/5 in all extremities. Sensation intact. Gait not checked.  PSYCHIATRIC: The patient is alert and oriented x 3.  Normal affect and good eye contact. SKIN: No obvious rash, lesion, or ulcer.   LABORATORY PANEL:   CBC Recent Labs  Lab 01/03/21 2141  WBC 9.0  HGB 12.2  HCT 35.5*  PLT 360   ------------------------------------------------------------------------------------------------------------------  Chemistries  No results for input(s): NA, K, CL, CO2, GLUCOSE, BUN, CREATININE, CALCIUM, MG, AST, ALT, ALKPHOS, BILITOT in the last 168 hours.  Invalid input(s): GFRCGP ------------------------------------------------------------------------------------------------------------------  Cardiac Enzymes No results for input(s): TROPONINI in the last 168 hours. ------------------------------------------------------------------------------------------------------------------  RADIOLOGY:  DG Knee Complete 4 Views Right  Result Date: 01/03/2021 CLINICAL DATA:  Recent fall EXAM: RIGHT KNEE - COMPLETE 4+ VIEW COMPARISON:  None. FINDINGS: There is a  transverse mildly comminuted fracture through the mid to lower portion of the patella. The fracture fragments are distracted by proximally 5.3 cm. No sizable joint effusion is seen. No other fracture or dislocation is noted. IMPRESSION: Comminuted mid to lower patellar fracture with distraction of the fracture fragments as described. Electronically Signed   By: Inez Catalina M.D.   On: 01/03/2021 22:19      IMPRESSION AND PLAN:  Principal Problem:   Right patella fracture  1.  Mechanical fall likely secondary to alcohol intoxication with subsequent right patellar fracture. - The patient will be admitted to a med-surgical bed. - Pain management will be provided. - The patient is knee with immobilizer in the ER. -  Orthopedic consult will be obtained. - The case was discussed with Dr. Mack Guise. - The patient has no history of coronary artery disease, CVA, CHF, renal failure with a creatinine more than 2 or diabetes mellitus on insulin.  Per the revised cardiac risk index the patient is considered at average risk for her age for perioperative cardiovascular events.  She has no current pulmonary issues.  2.  Acute alcohol intoxication. - Alcohol level came back 308 - The patient will be placed on a banana bag daily. - As needed IV Ativan will be given for alcohol withdrawal.  3.  Anxiety and depression. - We will continue Celexa and trazodone.  4.  History of breast cancer status post bilateral mastectomy, radiotherapy and letrozole management. - The patient is not anymore on letrozole.    DVT prophylaxis: SCDs.  Medical prophylaxis currently postponed pending postoperative period. Code Status: full code.  Family Communication:  The plan of care was discussed in details with the patient (and family). I answered all questions. The patient agreed to proceed with the above mentioned plan. Further management will depend upon hospital course. Disposition Plan: Back to previous home environment Consults called: Orthopedic consult. All the records are reviewed and case discussed with ED provider.  Status is: Inpatient  Remains inpatient appropriate because:Ongoing diagnostic testing needed not appropriate for outpatient work up, Unsafe d/c plan, IV treatments appropriate due to intensity of illness or inability to take PO, and Inpatient level of care appropriate due to severity of illness   Dispo: The patient is from: Home              Anticipated d/c is to: Home              Patient currently is not medically stable to d/c.              Difficult to place patient: No  TOTAL TIME TAKING CARE OF THIS PATIENT: 55 minutes.     Christel Mormon M.D on 01/03/2021 at 10:27 PM  [facility]   From 7  PM-7 AM, contact night-coverage [email or web address]  CC: Primary care physician; No primary care provider on file.

## 2021-01-03 NOTE — ED Provider Notes (Signed)
[facility] Emergency Department Provider Note   ____________________________________________    I have reviewed the triage vital signs and the nursing notes.   HISTORY  Chief Complaint Fall     HPI Wanda Price is a 55 y.o. female with history as noted below who presents after a fall.  Patient admits to drinking alcohol, denies drug abuse.  Reports she fell forward and landed on her right knee.  This occurred just prior to arrival.  History is limited because of alcohol intoxication  Past Medical History:  Diagnosis Date   Anxiety    Chronic UTI    Depression    Restless leg syndrome     Patient Active Problem List   Diagnosis Date Noted   Right patella fracture 01/03/2021   Recurrent major depression-severe (HCC) 07/19/2014   Generalized anxiety disorder 07/19/2014   Cocaine abuse (HCC) 07/19/2014    Past Surgical History:  Procedure Laterality Date   CESAREAN SECTION     LIPOSUCTION     TUBAL LIGATION  02/02/1995    Prior to Admission medications   Medication Sig Start Date End Date Taking? Authorizing Provider  citalopram (CELEXA) 40 MG tablet Take 40 mg by mouth daily. 11/28/20  Yes [provider]  traZODone (DESYREL) 50 MG tablet Take 1 tablet (50 mg total) by mouth at bedtime. 08/23/14  Yes Gabriel Cirri, NP  cyclobenzaprine (FLEXERIL) 5 MG tablet Take 5 mg by mouth 3 (three) times daily as needed. 12/22/20   [provider]  gabapentin (NEURONTIN) 100 MG capsule Take 100 mg by mouth 3 (three) times daily. Patient not taking: Reported on 01/03/2021 12/22/20   [provider]     Allergies Desvenlafaxine, Abilify [aripiprazole], Codeine, Macrobid [nitrofurantoin monohyd macro], Wellbutrin [bupropion], Zoloft [sertraline hcl], Bacitracin, Neomycin, and Polymyxin b  Family History  Problem Relation Age of Onset   Skin cancer Mother    Colon cancer Mother    Ovarian cancer Maternal Grandmother     Stroke Mother     Social History Social History   Tobacco Use   Smoking status: Former   Smokeless tobacco: Never  Substance Use Topics   Alcohol use: Yes    Alcohol/week: 0.0 standard drinks    Comment: occasionally    Review of Systems  Constitutional: No fever/chills Eyes: No visual changes.  ENT: No sore throat. Cardiovascular: Denies chest pain. Respiratory: Denies shortness of breath. Gastrointestinal: No abdominal pain.   Genitourinary: Negative for dysuria. Musculoskeletal: As of Skin: No laceration Neurological: Negative for headaches or weakness   ____________________________________________   PHYSICAL EXAM:  VITAL SIGNS: ED Triage Vitals  Enc Vitals Group     BP 01/03/21 2130 (!) 145/95     Pulse Rate 01/03/21 2130 (!) 114     Resp 01/03/21 2130 15     Temp 01/03/21 2130 98.1 F (36.7 C)     Temp Source 01/03/21 2130 Oral     SpO2 01/03/21 2130 98 %     Weight 01/03/21 2133 54 kg (119 lb)     Height 01/03/21 2133 1.562 m (5' 1.5")     Head Circumference --      Peak Flow --      Pain Score 01/03/21 2132 4     Pain Loc --      Pain Edu? --      Excl. in GC? --     Constitutional: Alert and oriented.  Anxious and tearful Eyes: Conjunctivae are normal.  Nose: No congestion/rhinnorhea. Mouth/Throat: Mucous membranes are moist.    Cardiovascular: Normal rate, regular rhythm. Grossly normal heart sounds.  Good peripheral circulation. Respiratory: Normal respiratory effort.  No retractions. Lungs CTAB. Gastrointestinal: Soft and nontender. No distention.   Genitourinary: deferred Musculoskeletal: Right knee: No abnormality with axial load, suspect transverse patella fracture Neurologic:  Normal speech and language. No gross focal neurologic deficits are appreciated.  Skin:  Skin is warm, dry and intact. No rash noted. Psychiatric: Mood and affect are normal. Speech and behavior are normal.  ____________________________________________    LABS (all labs ordered are listed, but only abnormal results are displayed)  Labs Reviewed  CBC - Abnormal; Notable for the following components:      Result Value   RBC 3.81 (*)    HCT 35.5 (*)    All other components within normal limits  COMPREHENSIVE METABOLIC PANEL - Abnormal; Notable for the following components:   Sodium 133 (*)    Glucose, Bld 106 (*)    AST 95 (*)    ALT 119 (*)    Alkaline Phosphatase 318 (*)    All other components within normal limits  ETHANOL - Abnormal; Notable for the following components:   Alcohol, Ethyl (B) 308 (*)    All other components within normal limits  RESP PANEL BY RT-PCR (FLU A&B, COVID) ARPGX2  APTT  PROTIME-INR  URINE DRUG SCREEN, QUALITATIVE ([facility] ONLY)  HIV ANTIBODY (ROUTINE TESTING W REFLEX)  BASIC METABOLIC PANEL  CBC  TYPE AND SCREEN   ____________________________________________  EKG   ____________________________________________  RADIOLOGY  Knee x-ray reviewed by me, patella fracture, transverse ____________________________________________   PROCEDURES  Procedure(s) performed: No  Procedures   Critical Care performed: No ____________________________________________   INITIAL IMPRESSION / ASSESSMENT AND PLAN / ED COURSE  Pertinent labs & imaging results that were available during my care of the patient were reviewed by me and considered in my medical decision making (see chart for details).   Patient presents after a fall related to alcohol intoxication.  Exam is concerning for transverse patellar fracture, confirmed by x-ray.  Patient treated with IV fentanyl.  Discussed with Dr. Martha Clan of orthopedics, who recommends n.p.o. after midnight, surgical repair in the morning  Discussed with Dr. Arville Care of the hospitalist service for admission    ____________________________________________   FINAL CLINICAL IMPRESSION(S) / ED DIAGNOSES  Final diagnoses:  Closed displaced longitudinal fracture of  right patella, initial encounter        Note:  This document was prepared using Dragon voice recognition software and may include unintentional dictation errors.    Jene Every, MD 01/03/21 2249

## 2021-01-04 ENCOUNTER — Inpatient Hospital Stay: Payer: 59

## 2021-01-04 ENCOUNTER — Encounter
Admission: EM | Disposition: A | Discharge status: Home Health | Payer: Self-pay | Source: Home / Self Care | Attending: Internal Medicine

## 2021-01-04 ENCOUNTER — Inpatient Hospital Stay: Payer: 59 | Admitting: Anesthesiology

## 2021-01-04 DIAGNOSIS — S82001S Unspecified fracture of right patella, sequela: Secondary | ICD-10-CM

## 2021-01-04 DIAGNOSIS — F10920 Alcohol use, unspecified with intoxication, uncomplicated: Secondary | ICD-10-CM

## 2021-01-04 HISTORY — PX: ORIF PATELLA: SHX5033

## 2021-01-04 LAB — CBC
HCT: 30.5 % — ABNORMAL LOW (ref 36.0–46.0)
HCT: 33.9 % — ABNORMAL LOW (ref 36.0–46.0)
Hemoglobin: 10.3 g/dL — ABNORMAL LOW (ref 12.0–15.0)
Hemoglobin: 11.7 g/dL — ABNORMAL LOW (ref 12.0–15.0)
MCH: 31.8 pg (ref 26.0–34.0)
MCH: 33.1 pg (ref 26.0–34.0)
MCHC: 33.8 g/dL (ref 30.0–36.0)
MCHC: 34.5 g/dL (ref 30.0–36.0)
MCV: 94.1 fL (ref 80.0–100.0)
MCV: 95.8 fL (ref 80.0–100.0)
Platelets: 308 10*3/uL (ref 150–400)
Platelets: 315 10*3/uL (ref 150–400)
RBC: 3.24 MIL/uL — ABNORMAL LOW (ref 3.87–5.11)
RBC: 3.54 MIL/uL — ABNORMAL LOW (ref 3.87–5.11)
RDW: 12.1 % (ref 11.5–15.5)
RDW: 12.2 % (ref 11.5–15.5)
WBC: 7.6 10*3/uL (ref 4.0–10.5)
WBC: 12.4 10*3/uL — ABNORMAL HIGH (ref 4.0–10.5)
nRBC: 0 % (ref 0.0–0.2)
nRBC: 0 % (ref 0.0–0.2)

## 2021-01-04 LAB — BASIC METABOLIC PANEL
Anion gap: 7 (ref 5–15)
BUN: 12 mg/dL (ref 6–20)
CO2: 24 mmol/L (ref 22–32)
Calcium: 8.2 mg/dL — ABNORMAL LOW (ref 8.9–10.3)
Chloride: 107 mmol/L (ref 98–111)
Creatinine, Ser: 0.63 mg/dL (ref 0.44–1.00)
GFR, Estimated: >60 mL/min (ref >60)
Glucose, Bld: 94 mg/dL (ref 70–99)
Potassium: 3.7 mmol/L (ref 3.5–5.1)
Sodium: 138 mmol/L (ref 135–145)

## 2021-01-04 LAB — CREATININE, SERUM
Creatinine, Ser: 0.8 mg/dL (ref 0.44–1.00)
GFR, Estimated: >60 mL/min (ref >60)

## 2021-01-04 LAB — PROTIME-INR
INR: 0.9 (ref 0.8–1.2)
Prothrombin Time: 12.3 seconds (ref 11.4–15.2)

## 2021-01-04 LAB — HIV ANTIBODY (ROUTINE TESTING W REFLEX): HIV Screen 4th Generation wRfx: NONREACTIVE

## 2021-01-04 LAB — APTT: aPTT: 28 seconds (ref 24–36)

## 2021-01-04 SURGERY — OPEN REDUCTION INTERNAL FIXATION (ORIF) PATELLA
Anesthesia: General | Site: Knee | Laterality: Right

## 2021-01-04 MED ORDER — SCOPOLAMINE 1 MG/3DAYS TD PT72
MEDICATED_PATCH | TRANSDERMAL | Status: AC
  Administered 2021-01-04: 13:00:00 1.5 mg via TRANSDERMAL
  Filled 2021-01-04: qty 1

## 2021-01-04 MED ORDER — DEXAMETHASONE SODIUM PHOSPHATE 10 MG/ML IJ SOLN
INTRAMUSCULAR | Status: DC | PRN
  Administered 2021-01-04: 10 mg via INTRAVENOUS

## 2021-01-04 MED ORDER — EPHEDRINE SULFATE 50 MG/ML IJ SOLN
INTRAMUSCULAR | Status: DC | PRN
  Administered 2021-01-04: 10 mg via INTRAVENOUS

## 2021-01-04 MED ORDER — HYDROMORPHONE HCL 1 MG/ML IJ SOLN
0.5000 mg | INTRAMUSCULAR | Status: DC | PRN
  Administered 2021-01-04: 21:00:00 1 mg via INTRAVENOUS
  Administered 2021-01-04: 17:00:00 0.5 mg via INTRAVENOUS
  Administered 2021-01-06 – 2021-01-07 (×5): 1 mg via INTRAVENOUS
  Filled 2021-01-04 (×6): qty 1

## 2021-01-04 MED ORDER — BISACODYL 5 MG PO TBEC: 5.0000 mg | DELAYED_RELEASE_TABLET | Freq: Every day | ORAL | Status: DC | PRN

## 2021-01-04 MED ORDER — MORPHINE SULFATE (PF) 2 MG/ML IV SOLN: 2.0000 mg | INTRAVENOUS | Status: DC | PRN

## 2021-01-04 MED ORDER — ONDANSETRON HCL 4 MG/2ML IJ SOLN
INTRAMUSCULAR | Status: DC | PRN
  Administered 2021-01-04: 4 mg via INTRAVENOUS

## 2021-01-04 MED ORDER — OXYCODONE HCL 5 MG PO TABS
10.0000 mg | ORAL_TABLET | ORAL | Status: DC | PRN
  Administered 2021-01-04 – 2021-01-06 (×6): 10 mg via ORAL
  Filled 2021-01-04 (×4): qty 2

## 2021-01-04 MED ORDER — PHENOL 1.4 % MT LIQD
1.0000 | OROMUCOSAL | Status: DC | PRN
  Filled 2021-01-04: qty 177

## 2021-01-04 MED ORDER — METHOCARBAMOL 500 MG PO TABS
500.0000 mg | ORAL_TABLET | Freq: Four times a day (QID) | ORAL | Status: DC | PRN
  Administered 2021-01-06: 500 mg via ORAL
  Filled 2021-01-04: qty 1

## 2021-01-04 MED ORDER — CEFAZOLIN SODIUM 1 G IJ SOLR
INTRAMUSCULAR | Status: AC
  Filled 2021-01-04: qty 20

## 2021-01-04 MED ORDER — MENTHOL 3 MG MT LOZG
1.0000 | LOZENGE | OROMUCOSAL | Status: DC | PRN
  Filled 2021-01-04: qty 9

## 2021-01-04 MED ORDER — ENOXAPARIN SODIUM 40 MG/0.4ML IJ SOSY
40.0000 mg | PREFILLED_SYRINGE | INTRAMUSCULAR | Status: DC
  Administered 2021-01-05 – 2021-01-07 (×3): 40 mg via SUBCUTANEOUS
  Filled 2021-01-04 (×3): qty 0.4

## 2021-01-04 MED ORDER — LACTATED RINGERS IV SOLN: INTRAVENOUS | Status: DC | PRN

## 2021-01-04 MED ORDER — MIDAZOLAM HCL 2 MG/2ML IJ SOLN
INTRAMUSCULAR | Status: AC
  Filled 2021-01-04: qty 2

## 2021-01-04 MED ORDER — LIDOCAINE HCL (CARDIAC) PF 100 MG/5ML IV SOSY
PREFILLED_SYRINGE | INTRAVENOUS | Status: DC | PRN
  Administered 2021-01-04: 100 mg via INTRAVENOUS

## 2021-01-04 MED ORDER — METHOCARBAMOL 1000 MG/10ML IJ SOLN
500.0000 mg | Freq: Four times a day (QID) | INTRAVENOUS | Status: DC | PRN
  Filled 2021-01-04: qty 5

## 2021-01-04 MED ORDER — LORAZEPAM 2 MG/ML IJ SOLN: 1.0000 mg | INTRAMUSCULAR | Status: DC | PRN

## 2021-01-04 MED ORDER — ACETAMINOPHEN 500 MG PO TABS
1000.0000 mg | ORAL_TABLET | Freq: Four times a day (QID) | ORAL | Status: AC
  Filled 2021-01-04: qty 2

## 2021-01-04 MED ORDER — SODIUM CHLORIDE 0.9 % IV SOLN: INTRAVENOUS | Status: DC

## 2021-01-04 MED ORDER — MIDAZOLAM HCL 2 MG/2ML IJ SOLN
INTRAMUSCULAR | Status: DC | PRN
  Administered 2021-01-04: 2 mg via INTRAVENOUS

## 2021-01-04 MED ORDER — DEXMEDETOMIDINE (PRECEDEX) IN NS 20 MCG/5ML (4 MCG/ML) IV SYRINGE
PREFILLED_SYRINGE | INTRAVENOUS | Status: AC
  Filled 2021-01-04: qty 5

## 2021-01-04 MED ORDER — DEXMEDETOMIDINE (PRECEDEX) IN NS 20 MCG/5ML (4 MCG/ML) IV SYRINGE
PREFILLED_SYRINGE | INTRAVENOUS | Status: DC | PRN
  Administered 2021-01-04 (×5): 8 ug via INTRAVENOUS

## 2021-01-04 MED ORDER — DEXMEDETOMIDINE (PRECEDEX) IN NS 20 MCG/5ML (4 MCG/ML) IV SYRINGE
PREFILLED_SYRINGE | INTRAVENOUS | Status: AC
  Filled 2021-01-04: qty 10

## 2021-01-04 MED ORDER — PROPOFOL 10 MG/ML IV BOLUS
INTRAVENOUS | Status: AC
  Filled 2021-01-04: qty 40

## 2021-01-04 MED ORDER — FENTANYL CITRATE (PF) 100 MCG/2ML IJ SOLN
INTRAMUSCULAR | Status: DC | PRN
  Administered 2021-01-04 (×4): 50 ug via INTRAVENOUS

## 2021-01-04 MED ORDER — CEFAZOLIN SODIUM-DEXTROSE 1-4 GM/50ML-% IV SOLN
1.0000 g | Freq: Four times a day (QID) | INTRAVENOUS | Status: AC
  Administered 2021-01-04 – 2021-01-05 (×2): 1 g via INTRAVENOUS
  Filled 2021-01-04 (×3): qty 50

## 2021-01-04 MED ORDER — TRAMADOL HCL 50 MG PO TABS
50.0000 mg | ORAL_TABLET | Freq: Four times a day (QID) | ORAL | Status: DC
  Administered 2021-01-04 – 2021-01-06 (×7): 50 mg via ORAL
  Filled 2021-01-04 (×7): qty 1

## 2021-01-04 MED ORDER — HYDROMORPHONE HCL 1 MG/ML IJ SOLN
INTRAMUSCULAR | Status: AC
  Filled 2021-01-04: qty 1

## 2021-01-04 MED ORDER — FENTANYL CITRATE (PF) 100 MCG/2ML IJ SOLN
INTRAMUSCULAR | Status: AC
  Administered 2021-01-04: 17:00:00 25 ug
  Filled 2021-01-04: qty 2

## 2021-01-04 MED ORDER — PHENYLEPHRINE HCL (PRESSORS) 10 MG/ML IV SOLN
INTRAVENOUS | Status: DC | PRN
  Administered 2021-01-04: 100 ug via INTRAVENOUS

## 2021-01-04 MED ORDER — OXYCODONE HCL 5 MG PO TABS
5.0000 mg | ORAL_TABLET | ORAL | Status: DC | PRN
  Administered 2021-01-05: 10 mg via ORAL
  Filled 2021-01-04 (×3): qty 2

## 2021-01-04 MED ORDER — ONDANSETRON HCL 4 MG/2ML IJ SOLN
4.0000 mg | Freq: Four times a day (QID) | INTRAMUSCULAR | Status: DC | PRN
  Administered 2021-01-04 – 2021-01-06 (×2): 4 mg via INTRAVENOUS
  Filled 2021-01-04 (×2): qty 2

## 2021-01-04 MED ORDER — ONDANSETRON HCL 4 MG/2ML IJ SOLN
INTRAMUSCULAR | Status: AC
  Filled 2021-01-04: qty 2

## 2021-01-04 MED ORDER — SENNOSIDES-DOCUSATE SODIUM 8.6-50 MG PO TABS: 1.0000 | ORAL_TABLET | Freq: Every evening | ORAL | Status: DC | PRN

## 2021-01-04 MED ORDER — FENTANYL CITRATE (PF) 100 MCG/2ML IJ SOLN
INTRAMUSCULAR | Status: AC
  Filled 2021-01-04: qty 2

## 2021-01-04 MED ORDER — ONDANSETRON HCL 4 MG PO TABS
4.0000 mg | ORAL_TABLET | Freq: Four times a day (QID) | ORAL | Status: DC | PRN
  Administered 2021-01-06: 4 mg via ORAL
  Filled 2021-01-04: qty 1

## 2021-01-04 MED ORDER — LIDOCAINE HCL (PF) 2 % IJ SOLN
INTRAMUSCULAR | Status: AC
  Filled 2021-01-04: qty 5

## 2021-01-04 MED ORDER — SCOPOLAMINE 1 MG/3DAYS TD PT72: 1.0000 | MEDICATED_PATCH | Freq: Once | TRANSDERMAL | Status: DC

## 2021-01-04 MED ORDER — FENTANYL CITRATE PF 50 MCG/ML IJ SOSY: 25.0000 ug | PREFILLED_SYRINGE | INTRAMUSCULAR | Status: DC | PRN

## 2021-01-04 MED ORDER — PROPOFOL 10 MG/ML IV BOLUS
INTRAVENOUS | Status: DC | PRN
  Administered 2021-01-04: 200 mg via INTRAVENOUS

## 2021-01-04 MED ORDER — DOCUSATE SODIUM 100 MG PO CAPS
100.0000 mg | ORAL_CAPSULE | Freq: Two times a day (BID) | ORAL | Status: DC
  Administered 2021-01-04 – 2021-01-07 (×6): 100 mg via ORAL
  Filled 2021-01-04 (×6): qty 1

## 2021-01-04 MED ORDER — 0.9 % SODIUM CHLORIDE (POUR BTL) OPTIME
TOPICAL | Status: DC | PRN
  Administered 2021-01-04: 14:00:00 1000 mL

## 2021-01-04 MED ORDER — CEFAZOLIN SODIUM-DEXTROSE 2-4 GM/100ML-% IV SOLN
2.0000 g | INTRAVENOUS | Status: AC
  Administered 2021-01-04: 14:00:00 2 g via INTRAVENOUS

## 2021-01-04 MED ORDER — KETOROLAC TROMETHAMINE 15 MG/ML IJ SOLN
15.0000 mg | Freq: Four times a day (QID) | INTRAMUSCULAR | Status: DC | PRN
  Administered 2021-01-04 – 2021-01-05 (×5): 15 mg via INTRAVENOUS
  Filled 2021-01-04 (×5): qty 1

## 2021-01-04 MED ORDER — DEXAMETHASONE SODIUM PHOSPHATE 10 MG/ML IJ SOLN
INTRAMUSCULAR | Status: AC
  Filled 2021-01-04: qty 1

## 2021-01-04 MED ORDER — DIPHENHYDRAMINE HCL 12.5 MG/5ML PO ELIX
12.5000 mg | ORAL_SOLUTION | ORAL | Status: DC | PRN
  Administered 2021-01-06: 12.5 mg via ORAL
  Filled 2021-01-04: qty 5

## 2021-01-04 MED ORDER — THIAMINE HCL 100 MG/ML IJ SOLN
Freq: Once | INTRAVENOUS | Status: AC
  Filled 2021-01-04: qty 1000

## 2021-01-04 MED ORDER — EPHEDRINE 5 MG/ML INJ
INTRAVENOUS | Status: AC
  Filled 2021-01-04: qty 5

## 2021-01-04 SURGICAL SUPPLY — 63 items
BIT DRILL 2.5X2.75 QC CALB (BIT) ×2 IMPLANT
BLADE SURG 15 STRL LF DISP TIS (BLADE) ×1 IMPLANT
BLADE SURG 15 STRL SS (BLADE) ×1
BLADE SURG SZ10 CARB STEEL (BLADE) ×2 IMPLANT
BNDG COHESIVE 4X5 TAN ST LF (GAUZE/BANDAGES/DRESSINGS) ×2 IMPLANT
BNDG ELASTIC 6X5.8 VLCR NS LF (GAUZE/BANDAGES/DRESSINGS) ×2 IMPLANT
BNDG ELASTIC 6X5.8 VLCR STR LF (GAUZE/BANDAGES/DRESSINGS) ×2 IMPLANT
BNDG ESMARK 6X12 TAN STRL LF (GAUZE/BANDAGES/DRESSINGS) ×2 IMPLANT
BRACE KNEE POST OP SHORT (BRACE) ×4 IMPLANT
CABLE PIN IMPLANT 35 (Cable) IMPLANT
COOLER POLAR GLACIER W/PUMP (MISCELLANEOUS) ×2 IMPLANT
CUFF TOURN SGL QUICK 24 (TOURNIQUET CUFF)
CUFF TRNQT CYL 24X4X16.5-23 (TOURNIQUET CUFF) IMPLANT
DRAPE C-ARM XRAY 36X54 (DRAPES) ×2 IMPLANT
DRAPE C-ARMOR (DRAPES) ×2 IMPLANT
DRAPE INCISE IOBAN 66X45 STRL (DRAPES) ×2 IMPLANT
DRAPE U-SHAPE 47X51 STRL (DRAPES) ×2 IMPLANT
DRSG AQUACEL AG ADV 3.5X10 (GAUZE/BANDAGES/DRESSINGS) ×2 IMPLANT
DURAPREP 26ML APPLICATOR (WOUND CARE) ×6 IMPLANT
ELECT REM PT RETURN 9FT ADLT (ELECTROSURGICAL) ×2
ELECTRODE REM PT RTRN 9FT ADLT (ELECTROSURGICAL) ×1 IMPLANT
GAUZE 4X4 16PLY ~~LOC~~+RFID DBL (SPONGE) ×2 IMPLANT
GAUZE SPONGE 4X4 12PLY STRL (GAUZE/BANDAGES/DRESSINGS) ×2 IMPLANT
GAUZE XEROFORM 1X8 LF (GAUZE/BANDAGES/DRESSINGS) ×2 IMPLANT
GLOVE SURG ORTHO LTX SZ9 (GLOVE) ×6 IMPLANT
GLOVE SURG UNDER POLY LF SZ9 (GLOVE) ×2 IMPLANT
GOWN STRL REUS TWL 2XL XL LVL4 (GOWN DISPOSABLE) ×2 IMPLANT
GOWN STRL REUS W/ TWL LRG LVL3 (GOWN DISPOSABLE) ×1 IMPLANT
GOWN STRL REUS W/TWL LRG LVL3 (GOWN DISPOSABLE) ×1
HANDLE YANKAUER SUCT BULB TIP (MISCELLANEOUS) ×2 IMPLANT
IMMOB KNEE 14 THIGH 24 706614 (SOFTGOODS) IMPLANT
K-WIRE ACE 1.6X6 (WIRE) ×2
KIT TURNOVER KIT A (KITS) ×2 IMPLANT
KWIRE ACE 1.6X6 (WIRE) ×1 IMPLANT
MANIFOLD NEPTUNE II (INSTRUMENTS) ×2 IMPLANT
NDL SAFETY ECLIPSE 18X1.5 (NEEDLE) ×1 IMPLANT
NEEDLE HYPO 18GX1.5 SHARP (NEEDLE) ×1
NEEDLE REVERSE CUT 1/2 CRC (NEEDLE) ×2 IMPLANT
NS IRRIG 500ML POUR BTL (IV SOLUTION) ×2 IMPLANT
PACK EXTREMITY ARMC (MISCELLANEOUS) ×2 IMPLANT
PAD ABD DERMACEA PRESS 5X9 (GAUZE/BANDAGES/DRESSINGS) ×2 IMPLANT
PAD PREP 24X41 OB/GYN DISP (PERSONAL CARE ITEMS) ×2 IMPLANT
PAD WRAPON POLAR KNEE (MISCELLANEOUS) ×1 IMPLANT
PADDING CAST 4IN STRL (MISCELLANEOUS) ×2
PADDING CAST ABS 4INX4YD NS (CAST SUPPLIES) ×1
PADDING CAST ABS COTTON 4X4 ST (CAST SUPPLIES) ×1 IMPLANT
PADDING CAST BLEND 4X4 STRL (MISCELLANEOUS) ×2 IMPLANT
RETRIEVER SUT HEWSON (MISCELLANEOUS) IMPLANT
SPONGE T-LAP 18X18 ~~LOC~~+RFID (SPONGE) ×2 IMPLANT
STAPLER SKIN PROX 35W (STAPLE) ×2 IMPLANT
STOCKINETTE BIAS CUT 6 980064 (GAUZE/BANDAGES/DRESSINGS) ×2 IMPLANT
STOCKINETTE IMPERVIOUS 9X36 MD (GAUZE/BANDAGES/DRESSINGS) ×2 IMPLANT
SUT FIBERWIRE #5 38 BLUE (WIRE) IMPLANT
SUT FIBERWIRE #5 38 CONV BLUE (SUTURE) ×4
SUT STEEL 7 (SUTURE) IMPLANT
SUT TICRON 2-0 30IN 311381 (SUTURE) ×4 IMPLANT
SUT VIC AB 0 CT1 36 (SUTURE) ×2 IMPLANT
SUT VIC AB 2-0 CT1 (SUTURE) ×4 IMPLANT
SUT VIC AB 2-0 CT1 36 (SUTURE) ×2 IMPLANT
SUTURE FIBERWR #5 38 CONV BLUE (SUTURE) ×2 IMPLANT
SYR 10ML LL (SYRINGE) ×2 IMPLANT
WATER STERILE IRR 1000ML POUR (IV SOLUTION) ×4 IMPLANT
WRAPON POLAR PAD KNEE (MISCELLANEOUS) ×2

## 2021-01-04 NOTE — Anesthesia Procedure Notes (Signed)
Procedure Name: LMA Insertion Date/Time: 01/04/2021 1:37 PM Performed by: Katherine Basset, CRNA Pre-anesthesia Checklist: Patient identified, Emergency Drugs available, Suction available and Patient being monitored Patient Re-evaluated:Patient Re-evaluated prior to induction Oxygen Delivery Method: Circle system utilized Preoxygenation: Pre-oxygenation with 100% oxygen Induction Type: IV induction Ventilation: Mask ventilation without difficulty LMA: LMA inserted LMA Size: 4.0 Number of attempts: 1 Airway Equipment and Method: Patient positioned with wedge pillow Tube secured with: Tape Dental Injury: Teeth and Oropharynx as per pre-operative assessment

## 2021-01-04 NOTE — Care Management (Signed)
Patient is having surgery today, will see patient in AM.  TOC to follow for needs.

## 2021-01-04 NOTE — Anesthesia Preprocedure Evaluation (Addendum)
Anesthesia Evaluation  Patient identified by MRN, date of birth, ID band Patient awake    Reviewed: Allergy & Precautions, NPO status , Patient's Chart, lab work & pertinent test results  History of Anesthesia Complications Negative for: history of anesthetic complications  Airway Mallampati: II  TM Distance: >3 FB Neck ROM: Full    Dental no notable dental hx.    Pulmonary neg pulmonary ROS, former smoker,    Pulmonary exam normal breath sounds clear to auscultation       Cardiovascular Exercise Tolerance: Good METS: 3 - Mets negative cardio ROS Normal cardiovascular exam Rhythm:Regular Rate:Normal     Neuro/Psych PSYCHIATRIC DISORDERS Anxiety Depression negative neurological ROS     GI/Hepatic Neg liver ROS, GERD  Controlled,  Endo/Other  negative endocrine ROS  Renal/GU negative Renal ROS  negative genitourinary   Musculoskeletal negative musculoskeletal ROS (+)   Abdominal   Peds  Hematology negative hematology ROS (+)   Anesthesia Other Findings   Reproductive/Obstetrics negative OB ROS                            Anesthesia Physical Anesthesia Plan  ASA: 2  Anesthesia Plan: General   Post-op Pain Management:    Induction: Intravenous  PONV Risk Score and Plan:   Airway Management Planned: LMA  Additional Equipment:   Intra-op Plan:   Post-operative Plan: Extubation in OR  Informed Consent: I have reviewed the patients History and Physical, chart, labs and discussed the procedure including the risks, benefits and alternatives for the proposed anesthesia with the patient or authorized representative who has indicated his/her understanding and acceptance.     Dental Advisory Given  Plan Discussed with: Anesthesiologist, CRNA and Surgeon  Anesthesia Plan Comments: (Patient consented for risks of anesthesia including but not limited to:  - adverse reactions to  medications - damage to eyes, teeth, lips or other oral mucosa - nerve damage due to positioning  - sore throat or hoarseness - Damage to heart, brain, nerves, lungs, other parts of body or loss of life  Patient voiced understanding.)        Anesthesia Quick Evaluation

## 2021-01-04 NOTE — Consult Note (Signed)
ORTHOPAEDIC CONSULTATION  REQUESTING PHYSICIAN: Enedina Finner, MD  Chief Complaint: Right patella fracture status post fall  HPI: Wanda Price is a 55 y.o. female who sustained a closed displaced and comminuted fracture of the right patella after a fall overnight.  Patient had a bottle of wine earlier in the evening.   Patient has had breast cancer and is status postmastectomy with recent breast reconstruction.  Patient complains of pain in the right knee.  Patient seen in the PACU with her son at the bedside.  Past Medical History:  Diagnosis Date   Anxiety    Chronic UTI    Depression    Restless leg syndrome    Past Surgical History:  Procedure Laterality Date   CESAREAN SECTION     LIPOSUCTION     TUBAL LIGATION  02/02/1995   Social History   Socioeconomic History   Marital status: Married    Spouse name: Not on file   Number of children: Not on file   Years of education: Not on file   Highest education level: Not on file  Occupational History   Occupation: Psychologist, clinical: [employer]  Tobacco Use   Smoking status: Former   Smokeless tobacco: Never  Substance and Sexual Activity   Alcohol use: Yes    Alcohol/week: 0.0 standard drinks    Comment: occasionally   Drug use: Not on file   Sexual activity: Yes    Birth control/protection: Condom  Other Topics Concern   Not on file  Social History Narrative   Not on file   Social Determinants of Health   Financial Resource Strain: Not on file  Food Insecurity: Not on file  Transportation Needs: Not on file  Physical Activity: Not on file  Stress: Not on file  Social Connections: Not on file   Family History  Problem Relation Age of Onset   Skin cancer Mother    Colon cancer Mother    Ovarian cancer Maternal Grandmother    Stroke Mother    Allergies  Allergen Reactions   Desvenlafaxine Shortness Of Breath   Abilify [Aripiprazole] Other (See Comments)    Facial Disturbance   Codeine    Macrobid  [Nitrofurantoin Monohyd Macro] Other (See Comments)    Severe headache and nausea   Wellbutrin [Bupropion] Other (See Comments)    Mood Changes   Zoloft [Sertraline Hcl] Other (See Comments)    Wired, unable to sleep   Bacitracin Rash   Neomycin Rash   Polymyxin B Rash   Prior to Admission medications   Medication Sig Start Date End Date Taking? Authorizing Provider  citalopram (CELEXA) 40 MG tablet Take 40 mg by mouth daily. 11/28/20  Yes [provider]  traZODone (DESYREL) 50 MG tablet Take 1 tablet (50 mg total) by mouth at bedtime. 08/23/14  Yes Gabriel Cirri, NP  cyclobenzaprine (FLEXERIL) 5 MG tablet Take 5 mg by mouth 3 (three) times daily as needed. 12/22/20   [provider]  gabapentin (NEURONTIN) 100 MG capsule Take 100 mg by mouth 3 (three) times daily. Patient not taking: Reported on 01/03/2021 12/22/20   [provider]   CT KNEE RIGHT WO CONTRAST  Result Date: 01/04/2021 CLINICAL DATA:  RIGHT knee fracture last night. Preoperative imaging. EXAM: CT OF THE RIGHT KNEE WITHOUT CONTRAST TECHNIQUE: Multidetector CT imaging of the RIGHT knee was performed according to the standard protocol. Multiplanar CT image reconstructions were also generated. COMPARISON:  Plain film of the RIGHT knee dated  01/03/2021. FINDINGS: Significantly displaced fracture of the RIGHT patella. The main fracture fragments are displaced approximately 5 cm. Lower pole fragment contains a comminuted fracture. No medial or lateral patellar displacement to suggest concomitant retinaculum tear. No additional fracture line identified within the distal femur, proximal tibia or proximal fibula. Anterior and posterior cruciate ligaments appear grossly intact. Small joint effusion. IMPRESSION: 1. Significantly displaced fracture of the RIGHT patella. The main fracture fragments are displaced approximately 5 cm. Lower pole fragment contains slightly displaced comminuted fracture lines. 2. No  medial or lateral patellar displacement to suggest concomitant retinaculum tear. 3. Anterior and posterior cruciate ligaments appear intact and normal in alignment. 4. Small joint effusion. Electronically Signed   By: Bary Richard M.D.   On: 01/04/2021 10:45   DG Knee Complete 4 Views Right  Result Date: 01/03/2021 CLINICAL DATA:  Recent fall EXAM: RIGHT KNEE - COMPLETE 4+ VIEW COMPARISON:  None. FINDINGS: There is a transverse mildly comminuted fracture through the mid to lower portion of the patella. The fracture fragments are distracted by proximally 5.3 cm. No sizable joint effusion is seen. No other fracture or dislocation is noted. IMPRESSION: Comminuted mid to lower patellar fracture with distraction of the fracture fragments as described. Electronically Signed   By: Alcide Clever M.D.   On: 01/03/2021 22:19    Positive ROS: All other systems have been reviewed and were otherwise negative with the exception of those mentioned in the HPI and as above.  Physical Exam: General: Alert, no acute distress  MUSCULOSKELETAL: Right knee: Patient skin is intact.  There is no erythema ecchymosis or significant effusion.  Distally the patient is neurovascular intact.  She has palpable defect and tenderness to palpation of the right patella.  Her leg compartments are soft and compressible.  Assessment: Right knee closed, comminuted and displaced patella fracture  Plan: I reviewed the patient's x-rays.  I ordered a CT scan to better evaluate the fracture.  The inferior patella fragment is comminuted into at least 4 smaller fragments with mild displacement.  Given the displacement of the fracture I recommended open reduction internal fixation.  I reviewed the details of the operation as well as the postoperative course with the patient.  I discussed the risks and benefits of surgery as well. The patient and her son understand the risks include but are not limited to infection, bleeding requiring blood  transfusion, nerve or blood vessel injury, joint stiffness or loss of motion, persistent pain, weakness or instability, malunion, nonunion and hardware failure and the need for further surgery.  Patient understood and agreed with the plan for surgery.   Juanell Fairly, MD    01/04/2021 1:22 PM

## 2021-01-04 NOTE — Op Note (Signed)
01/04/2021  4:27 PM  PATIENT:  Wanda Price    PRE-OPERATIVE DIAGNOSIS:  Right closed comminuted and displaced patella fracture  POST-OPERATIVE DIAGNOSIS:  Same  PROCEDURE:  OPEN REDUCTION INTERNAL (ORIF) FIXATION OF RIGHT PATELLA FRACTURE  SURGEON:  Thornton Park, MD  ANESTHESIA:   General  EBL:  25 cc  Tourniquest time:  104 minutes  PREOPERATIVE INDICATIONS:  Wanda Price is a  55 y.o. female with a diagnosis of closed, comminuted and displaced patella fracture who failed conservative measures and elected for surgical management.    I discussed the risks and benefits of surgery. The risks include but are not limited to infection, bleeding requiring blood transfusion, nerve or blood vessel injury, joint stiffness or loss of motion, persistent pain, weakness or instability, malunion, nonunion and hardware failure and the need for further surgery. Medical risks include but are not limited to DVT and pulmonary embolism, myocardial infarction, stroke, pneumonia, respiratory failure and death. Patient understood these risks and wished to proceed.    OPERATIVE IMPLANTS: #5 Fiber Wire Sutures x 3  OPERATIVE FINDINGS: Displaced transverse fracture of the right patella with severe comminution of the inferior pole fragment   OPERATIVE PROCEDURE: The patient was met in the preoperative area with her son at the bedside. I explained to them that the patient had a significant fracture to the right patella involving severe comminution of the inferior pole based on CT scan. I reviewed the surgical plan for surgical fixation of her patella fracture with a figure-of-eight construct with either screws and wires, wires only or sutures depending on the degree of comminution encountered during surgery.. I reviewed the risks and benefits of surgery. She understood the risks included but are not limited to infection, bleeding, nerve or blood vessel injury, knee stiffness, persistent pain,  posttraumatic patellofemoral osteoarthritis, malunion, nonunion, hardware failure, failure of the repair and the need for further surgery. Patient and her son understood these risks and agreed with the plan for surgery.  Patient was then brought to the operating room. She was placed supine on the operative table. She underwent general anesthesia.  Patient had a tourniquet applied to the right upper thigh. She was prepped and draped in a sterile fashion. A timeout was performed to verify the patient's name, date of birth, medical record number, correct site of surgery correct procedure to be performed. Was also used to verify the patient received antibiotics appropriate instruments, implants and radiographic studies were available in the room. Once all in attendance were in agreement case began.  A vertical incision was made centered over the patella. Full-thickness skin flaps were developed. The fracture was then identified. The retinaculum was also torn both medially and laterally. The fracture was copiously irrigated.  The hemarthrosis was evacuated with suction. There were no loose chondral fragments seen in the knee joint and no focal chondral lesions seen of the distal femur/trochlea. A curette was used to gently remove hematoma and interposed soft tissue.  The inferior pole was observed to have both vertical and horizontal fragments.  A large fracture clamp was then used to try to reduce the fracture.  However adequate reduction could not be achieved with the fracture reduction clamps as the inferior pole fragments were displaced with any reduction force.  Attempted placement of a 0.062 K wire also caused the inferior pole fragments to separate.  Therefore the decision was made to place a cerclage around the patella with a #5 FiberWire.  Once this initial cerclage was in  place the fracture appeared well reduced on AP and lateral C arm images.  A #5 FiberWire was used to make a second circumferential  cerclage cerclage around the patella.  Then #5 FiberWire suture was used to make a figure-of-eight construct passing through the undersurface tendon crossing diagonally over the dome of the patella, passing back through the patella tendon and brought diagonally across the patella.  This stabilized the horizontal fracture fragments of the inferior pole of the patella.  2 additional figure-of-eight construct were passed in a similar fashion obliquely from approximately the 4:00 to 10:00 direction and the 8:00 to 2 o'clock position.  The 2 cerclage fiber wires and 3 figure-of-eight FiberWire's encapsulated the patella captured on the small fracture fragments.  C arm images of the patella construct were taken at the conclusion the case.  There is no articular step-off or significant diastases of any of the fracture fragments.  The wound was copiously irrigated.  The retinaculum medially and laterally were repaired with #2 Tycron.  The knot stacks from the #5 FiberWire were reduced and held into position with 2-0 Vicryl suture.  The Subcutaneous tissue was closed with an interrupted 2-0 Vicryl and the skin was approximated with staples.  A dry sterile dressing was applied including an Aquacel, 4 x 4's ABDs and an Ace wrap.  A Polar Care was applied over the right knee.  The right knee was then locked in extension in a hinged knee brace.  All sharp and instrument counts were correct at the conclusion the case. I was scrubbed and present the entire case. I spoke with the patient's son, who was at work, by phone from the PACU to let them know the patient was stable in the recovery room in the case had been performed without complication.

## 2021-01-04 NOTE — Anesthesia Postprocedure Evaluation (Signed)
Anesthesia Post Note  Patient: Wanda Price  Procedure(s) Performed: OPEN REDUCTION INTERNAL (ORIF) FIXATION PATELLA (Right: Knee)  Anesthesia Type: General Anesthetic complications: no   No notable events documented.   Last Vitals:  Vitals:   01/04/21 2017 01/04/21 2325  BP: 138/80 (!) 146/83  Pulse: 79 87  Resp: 16 17  Temp: 37.1 C 36.7 C  SpO2: 99% 94%    Last Pain:  Vitals:   01/04/21 2328  TempSrc:   PainSc: 6                  Felicita Gage

## 2021-01-04 NOTE — Progress Notes (Signed)
[facility]  - [place] at [facility]   PATIENT NAME: Wanda Price    MR#:  [id number]  DATE OF BIRTH:  [birth year]  SUBJECTIVE:  patient son at bedside. Came in after she had a fall in the bathroom at the restaurant. Patient reported drinking wine with dinner yesterday. Complains of knee pain. NPO for surgery.  REVIEW OF SYSTEMS:   Review of Systems  Constitutional:  Negative for chills, fever and weight loss.  HENT:  Negative for ear discharge, ear pain and nosebleeds.   Eyes:  Negative for blurred vision, pain and discharge.  Respiratory:  Negative for sputum production, shortness of breath, wheezing and stridor.   Cardiovascular:  Negative for chest pain, palpitations, orthopnea and PND.  Gastrointestinal:  Negative for abdominal pain, diarrhea, nausea and vomiting.  Genitourinary:  Negative for frequency and urgency.  Musculoskeletal:  Positive for falls and joint pain. Negative for back pain.  Neurological:  Positive for weakness. Negative for sensory change, speech change and focal weakness.  Psychiatric/Behavioral:  Negative for depression and hallucinations. The patient is not nervous/anxious.   Tolerating Diet:npo Tolerating PT:   DRUG ALLERGIES:   Allergies  Allergen Reactions   Desvenlafaxine Shortness Of Breath   Abilify [Aripiprazole] Other (See Comments)    Facial Disturbance   Codeine    Macrobid [Nitrofurantoin Monohyd Macro] Other (See Comments)    Severe headache and nausea   Wellbutrin [Bupropion] Other (See Comments)    Mood Changes   Zoloft [Sertraline Hcl] Other (See Comments)    Wired, unable to sleep   Bacitracin Rash   Neomycin Rash   Polymyxin B Rash    VITALS:  Blood pressure 122/73, pulse 87, temperature 98.9 F (37.2 C), resp. rate 20, height 5' 1.5" (1.562 m), weight 54 kg, SpO2 100 %.  PHYSICAL EXAMINATION:   Physical Exam  GENERAL:  55 y.o.-year-old patient lying in the bed with no acute distress.  HEENT: Head  atraumatic, normocephalic. Oropharynx and nasopharynx clear.  LUNGS: Normal breath sounds bilaterally, no wheezing, rales, rhonchi. No use of accessory muscles of respiration.  CARDIOVASCULAR: S1, S2 normal. No murmurs, rubs, or gallops.  ABDOMEN: Soft, nontender, nondistended. Bowel sounds present. No organomegaly or mass.  EXTREMITIES: right knee brace + NEUROLOGIC: nonfocal PSYCHIATRIC:  patient is alert and oriented x 3.  SKIN: No obvious rash, lesion, or ulcer.   LABORATORY PANEL:  CBC Recent Labs  Lab 01/04/21 0611  WBC 7.6  HGB 10.3*  HCT 30.5*  PLT 308    Chemistries  Recent Labs  Lab 01/03/21 2141 01/04/21 0611  NA 133* 138  K 3.7 3.7  CL 99 107  CO2 25 24  GLUCOSE 106* 94  BUN 14 12  CREATININE 0.85 0.63  CALCIUM 9.0 8.2*  AST 95*  --   ALT 119*  --   ALKPHOS 318*  --   BILITOT 0.5  --    Cardiac Enzymes No results for input(s): TROPONINI in the last 168 hours. RADIOLOGY:  CT KNEE RIGHT WO CONTRAST  Result Date: 01/04/2021 CLINICAL DATA:  RIGHT knee fracture last night. Preoperative imaging. EXAM: CT OF THE RIGHT KNEE WITHOUT CONTRAST TECHNIQUE: Multidetector CT imaging of the RIGHT knee was performed according to the standard protocol. Multiplanar CT image reconstructions were also generated. COMPARISON:  Plain film of the RIGHT knee dated 01/03/2021. FINDINGS: Significantly displaced fracture of the RIGHT patella. The main fracture fragments are displaced approximately 5 cm. Lower pole fragment contains a comminuted fracture. No medial or  lateral patellar displacement to suggest concomitant retinaculum tear. No additional fracture line identified within the distal femur, proximal tibia or proximal fibula. Anterior and posterior cruciate ligaments appear grossly intact. Small joint effusion. IMPRESSION: 1. Significantly displaced fracture of the RIGHT patella. The main fracture fragments are displaced approximately 5 cm. Lower pole fragment contains slightly  displaced comminuted fracture lines. 2. No medial or lateral patellar displacement to suggest concomitant retinaculum tear. 3. Anterior and posterior cruciate ligaments appear intact and normal in alignment. 4. Small joint effusion. Electronically Signed   By: Bary Richard M.D.   On: 01/04/2021 10:45   DG Knee Complete 4 Views Right  Result Date: 01/03/2021 CLINICAL DATA:  Recent fall EXAM: RIGHT KNEE - COMPLETE 4+ VIEW COMPARISON:  None. FINDINGS: There is a transverse mildly comminuted fracture through the mid to lower portion of the patella. The fracture fragments are distracted by proximally 5.3 cm. No sizable joint effusion is seen. No other fracture or dislocation is noted. IMPRESSION: Comminuted mid to lower patellar fracture with distraction of the fracture fragments as described. Electronically Signed   By: Alcide Clever M.D.   On: 01/03/2021 22:19   ASSESSMENT AND PLAN:  TAURI ETHINGTON is a 55 y.o. Caucasian female with medical history significant for anxiety, depression and restless leg syndrome, as well as right breast cancer status post bilateral mastectomy, chemotherapy and radiotherapy, who presented to the ER with acute onset of mechanical fall landing on her right knee with subsequent severe right knee pain.  She stated that she slid on her knee.  Mechanical fall likely secondary to alcohol intoxication with subsequent right patellar fracture. - right knee with immobilizer  - Orthopedic consult with Dr. Ovid Curd for OR today - The patient has no history of coronary artery disease, CVA, CHF, renal failure with a creatinine more than 2 or diabetes mellitus on insulin.  Per the revised cardiac risk index the patient is considered at average risk for her age for perioperative cardiovascular events.  She has no current pulmonary issues.  Acute alcohol intoxication. - Alcohol level came back 308 --w/f WD. Pt reports she is social drinker. No s/o WD noted today   Anxiety and  depression. - continue Celexa and trazodone.  History of breast cancer status post bilateral mastectomy, radiotherapy  --recently had reconstructive surgery at [facility]   DVT prophylaxis: SCDs.  Medical prophylaxis currently postponed pending postoperative period. Code Status: full code.  Family Communication:  son  Level of care: Med-Surg Status is: Inpatient  Remains inpatient appropriate because: right patellar fracture        TOTAL TIME TAKING CARE OF THIS PATIENT: 30 minutes.  >50% time spent on counselling and coordination of care  Note: This dictation was prepared with Dragon dictation along with smaller phrase technology. Any transcriptional errors that result from this process are unintentional.  Enedina Finner M.D    [facility]   CC: Primary care physician; Ezekiel Slocumb, MD Patient ID: Ronalee Belts, female   DOB: Jul 03, 1965, 55 y.o.   MRN: [id number]

## 2021-01-04 NOTE — Transfer of Care (Signed)
Immediate Anesthesia Transfer of Care Note  Patient: Wanda Price  Procedure(s) Performed: OPEN REDUCTION INTERNAL (ORIF) FIXATION PATELLA (Right: Knee)  Patient Location: PACU  Anesthesia Type:General  Level of Consciousness: drowsy  Airway & Oxygen Therapy: Patient Spontanous Breathing and Patient connected to nasal cannula oxygen  Post-op Assessment: Report given to RN and Post -op Vital signs reviewed and stable  Post vital signs: Reviewed and stable  Last Vitals:  Vitals Value Taken Time  BP 106/57 01/04/21 1606  Temp    Pulse 75 01/04/21 1611  Resp 11 01/04/21 1611  SpO2 100 % 01/04/21 1611  Vitals shown include unvalidated device data.  Last Pain:  Vitals:   01/04/21 0806  TempSrc:   PainSc: Asleep         Complications: No notable events documented.

## 2021-01-05 ENCOUNTER — Encounter: Payer: Self-pay | Admitting: Orthopedic Surgery

## 2021-01-05 ENCOUNTER — Telehealth: Payer: Self-pay

## 2021-01-05 LAB — BASIC METABOLIC PANEL
Anion gap: 8 (ref 5–15)
BUN: 10 mg/dL (ref 6–20)
CO2: 24 mmol/L (ref 22–32)
Calcium: 8.9 mg/dL (ref 8.9–10.3)
Chloride: 105 mmol/L (ref 98–111)
Creatinine, Ser: 0.73 mg/dL (ref 0.44–1.00)
GFR, Estimated: >60 mL/min (ref >60)
Glucose, Bld: 142 mg/dL — ABNORMAL HIGH (ref 70–99)
Potassium: 4.6 mmol/L (ref 3.5–5.1)
Sodium: 137 mmol/L (ref 135–145)

## 2021-01-05 LAB — CBC
HCT: 31.8 % — ABNORMAL LOW (ref 36.0–46.0)
Hemoglobin: 11.1 g/dL — ABNORMAL LOW (ref 12.0–15.0)
MCH: 33 pg (ref 26.0–34.0)
MCHC: 34.9 g/dL (ref 30.0–36.0)
MCV: 94.6 fL (ref 80.0–100.0)
Platelets: 333 10*3/uL (ref 150–400)
RBC: 3.36 MIL/uL — ABNORMAL LOW (ref 3.87–5.11)
RDW: 12 % (ref 11.5–15.5)
WBC: 14.8 10*3/uL — ABNORMAL HIGH (ref 4.0–10.5)
nRBC: 0 % (ref 0.0–0.2)

## 2021-01-05 MED ORDER — SIMETHICONE 80 MG PO CHEW
80.0000 mg | CHEWABLE_TABLET | Freq: Four times a day (QID) | ORAL | Status: DC | PRN
  Administered 2021-01-05: 80 mg via ORAL
  Filled 2021-01-05 (×2): qty 1

## 2021-01-05 NOTE — Progress Notes (Signed)
Physical Therapy Treatment Patient Details Name: Wanda Price MRN: [id number] DOB: 1966/01/21 Today's Date: 01/05/2021   History of Present Illness 55yo female sustained a closed displaced and comminuted fracture of the R patella after a fall. Now s/p ORIF, toe touch weightbearing. PMHx includes anxiety, chronic UTI, depression, restless leg syndrome, breast cancer s/p bilateral  mastectomy on 05/2020 with recent L breast reconstruction, LUE NWBing per surgeon.    PT Comments    Pt received in Semi-Fowler's position and agreeable to therapy.  Pt's father in room upon arrival as well.  Pt and therapist discussed d/c options and recommendations for exercises and eliminating any obstacles at home in order for a successful recovery.  Therapist discusses weight bearing precautions and advised her that if she had a friend or family member with a ramp and walk-in shower, that would be most beneficial at this time.  Pt is scheduled to have a follow-up appointment with plastic surgeon and is hoping that she will be cleared to weight-bear through the L UE which would allow for ambulation utilizing a walker.  Pt agreeable and father in room may be willing to accommodate as he has specific request already installed in home.  Pt advised to discuss with father and therapy would continue to work with pt for transfer training and exercises.  Current discharge plans to home with HHPT remain appropriate at this time.  Pt will continue to benefit from skilled therapy in order to address deficits listed below.      Recommendations for follow up therapy are one component of a multi-disciplinary discharge planning process, led by the attending physician.  Recommendations may be updated based on patient status, additional functional criteria and insurance authorization.  Follow Up Recommendations  Home health PT     Assistance Recommended at Discharge Frequent or constant Supervision/Assistance  Equipment  Recommendations  BSC/3in1;Wheelchair (measurements PT)    Recommendations for Other Services       Precautions / Restrictions Precautions Precautions: Knee;Fall Required Braces or Orthoses: Knee Immobilizer - Right Knee Immobilizer - Right: On at all times Restrictions Weight Bearing Restrictions: Yes LUE Weight Bearing: Non weight bearing RLE Weight Bearing: Touchdown weight bearing     Mobility  Bed Mobility Overal bed mobility: Needs Assistance Bed Mobility: Supine to Sit     Supine to sit: Min assist;HOB elevated     General bed mobility comments: deferred per pt request.    Transfers Overall transfer level: Needs assistance Equipment used: Hemi-walker Transfers: Sit to/from Stand;Bed to chair/wheelchair/BSC Sit to Stand: Min guard;+2 safety/equipment Stand pivot transfers: Min guard;+2 physical assistance;+2 safety/equipment         General transfer comment: slow, pt maintains RLE NWBing with brace in place    Ambulation/Gait                   Stairs             Wheelchair Mobility    Modified Rankin (Stroke Patients Only)       Balance Overall balance assessment: Needs assistance;History of Falls Sitting-balance support: Feet supported;No upper extremity supported Sitting balance-Leahy Scale: Good     Standing balance support: Single extremity supported;During functional activity;Reliant on assistive device for balance Standing balance-Leahy Scale: Fair Standing balance comment: reliant on hemiwalker but no overt LOB                            Cognition Arousal/Alertness: Awake/alert Behavior During Therapy:  WFL for tasks assessed/performed Overall Cognitive Status: Within Functional Limits for tasks assessed                                 General Comments: pt much more alert and noted she is not feeling nearly as "loopy" as she was earlier        Exercises Total Joint Exercises Ankle  Circles/Pumps: AROM;Strengthening;Both;10 reps;Seated Quad Sets: AROM;Strengthening;Both;10 reps;Seated Gluteal Sets: AROM;Strengthening;Both;10 reps;Seated Hip ABduction/ADduction: AROM;Strengthening;Both;10 reps;Seated Straight Leg Raises: AROM;Strengthening;Both;10 reps;Seated Other Exercises Other Exercises: Pt educated in roles of Pt and services provided during hospital stay.  Pt also edcuated on the importance of exercise and maintaining good muscle control to prevent muscle atrophy s/p surgical intervention.    General Comments        Pertinent Vitals/Pain Pain Assessment: 0-10 Pain Score: 7  Pain Location: R knee Pain Descriptors / Indicators: Aching;Grimacing Pain Intervention(s): Limited activity within patient's tolerance;Monitored during session;Premedicated before session;Repositioned;Ice applied    Home Living Family/patient expects to be discharged to:: Private residence Living Arrangements: Alone Available Help at Discharge: Family;Friend(s);Available PRN/intermittently;Available 24 hours/day;Other (Comment) (pt reports she likely has someone who can stay with her 24/7 for a few days but not verified) Type of Home: Apartment Home Access: Stairs to enter Entrance Stairs-Rails: Right;Left Entrance Stairs-Number of Steps: 3 Alternate Level Stairs-Number of Steps: her apt is only on main floor Home Layout: Two level;Able to live on main level with bedroom/bathroom   Additional Comments: Pt reports mom's home is level entry, has walk-in shower, but mom has dog (pt's cat cannot stay there bc of dog)    Prior Function            PT Goals (current goals can now be found in the care plan section) Acute Rehab PT Goals Patient Stated Goal: to go home. PT Goal Formulation: With patient Time For Goal Achievement: 01/19/21 Potential to Achieve Goals: Good Progress towards PT goals: Progressing toward goals    Frequency    BID      PT Plan      Co-evaluation  PT/OT/SLP Co-Evaluation/Treatment: Yes Reason for Co-Treatment: For patient/therapist safety;To address functional/ADL transfers PT goals addressed during session: Mobility/safety with mobility;Balance;Proper use of DME OT goals addressed during session: ADL's and self-care;Proper use of Adaptive equipment and DME      AM-PAC PT "6 Clicks" Mobility   Outcome Measure  Help needed turning from your back to your side while in a flat bed without using bedrails?: None Help needed moving from lying on your back to sitting on the side of a flat bed without using bedrails?: None Help needed moving to and from a bed to a chair (including a wheelchair)?: A Lot Help needed standing up from a chair using your arms (e.g., wheelchair or bedside chair)?: A Little Help needed to walk in hospital room?: Total Help needed climbing 3-5 steps with a railing? : Total 6 Click Score: 15    End of Session Equipment Utilized During Treatment: Right knee immobilizer Activity Tolerance: Patient tolerated treatment well Patient left: in bed;with call bell/phone within reach;with bed alarm set;with family/visitor present Nurse Communication: Mobility status PT Visit Diagnosis: Unsteadiness on feet (R26.81);Muscle weakness (generalized) (M62.81);Difficulty in walking, not elsewhere classified (R26.2);Pain Pain - Right/Left: Right Pain - part of body: Knee     Time: 5053-9767 PT Time Calculation (min) (ACUTE ONLY): 17 min  Charges:  $Self Care/Home Management: 8-22  Nolon Bussing, PT, DPT 01/05/21, 4:53 PM    Phineas Real 01/05/2021, 4:49 PM

## 2021-01-05 NOTE — Telephone Encounter (Signed)
Received callback from provider regarding weightbearing status of LUE s/p breast reconstruction. Spoke to [place], RN from [facility] who reports patient should still be NWB on LUE, despite current patella fracture, advises against use of LUE on assistive devices for mobility needs.   12:03 PM, 01/05/21 Rosamaria Lints, PT, DPT Physical Therapist - [facility]  [phone] [facility])

## 2021-01-05 NOTE — Progress Notes (Signed)
[facility]  - [place] [facility]   PATIENT NAME: Wanda Price    MR#:  [id number]  DATE OF BIRTH:  September 27, 1965  SUBJECTIVE:  postop day one repair of patellar fracture no family at bedside. Denies any complaints other than PAIN on the right knee  REVIEW OF SYSTEMS:   Review of Systems  Constitutional:  Negative for chills, fever and weight loss.  HENT:  Negative for ear discharge, ear pain and nosebleeds.   Eyes:  Negative for blurred vision, pain and discharge.  Respiratory:  Negative for sputum production, shortness of breath, wheezing and stridor.   Cardiovascular:  Negative for chest pain, palpitations, orthopnea and PND.  Gastrointestinal:  Negative for abdominal pain, diarrhea, nausea and vomiting.  Genitourinary:  Negative for frequency and urgency.  Musculoskeletal:  Positive for falls and joint pain. Negative for back pain.  Neurological:  Positive for weakness. Negative for sensory change, speech change and focal weakness.  Psychiatric/Behavioral:  Negative for depression and hallucinations. The patient is not nervous/anxious.   Tolerating Diet:YES Tolerating PT: home health  DRUG ALLERGIES:   Allergies  Allergen Reactions   Desvenlafaxine Shortness Of Breath   Abilify [Aripiprazole] Other (See Comments)    Facial Disturbance   Codeine    Macrobid [Nitrofurantoin Monohyd Macro] Other (See Comments)    Severe headache and nausea   Wellbutrin [Bupropion] Other (See Comments)    Mood Changes   Zoloft [Sertraline Hcl] Other (See Comments)    Wired, unable to sleep   Bacitracin Rash   Neomycin Rash   Polymyxin B Rash    VITALS:  Blood pressure 129/69, pulse 96, temperature 98.1 F (36.7 C), resp. rate 18, height 5' 1.5" (1.562 m), weight 54 kg, SpO2 99 %.  PHYSICAL EXAMINATION:   Physical Exam  GENERAL:  55 y.o.-year-old patient lying in the bed with no acute distress.  HEENT: Head atraumatic, normocephalic. Oropharynx and nasopharynx  clear.  LUNGS: Normal breath sounds bilaterally, no wheezing, rales, rhonchi. No use of accessory muscles of respiration.  CARDIOVASCULAR: S1, S2 normal. No murmurs, rubs, or gallops.  ABDOMEN: Soft, nontender, nondistended. Bowel sounds present. No organomegaly or mass.  EXTREMITIES: right knee surgical dressing and brace NEUROLOGIC: nonfocal PSYCHIATRIC:  patient is alert and oriented x 3.  SKIN: No obvious rash, lesion, or ulcer.   LABORATORY PANEL:  CBC Recent Labs  Lab 01/05/21 0454  WBC 14.8*  HGB 11.1*  HCT 31.8*  PLT 333     Chemistries  Recent Labs  Lab 01/03/21 2141 01/04/21 0611 01/05/21 0454  NA 133*   < > 137  K 3.7   < > 4.6  CL 99   < > 105  CO2 25   < > 24  GLUCOSE 106*   < > 142*  BUN 14   < > 10  CREATININE 0.85   < > 0.73  CALCIUM 9.0   < > 8.9  AST 95*  --   --   ALT 119*  --   --   ALKPHOS 318*  --   --   BILITOT 0.5  --   --    < > = values in this interval not displayed.    Cardiac Enzymes No results for input(s): TROPONINI in the last 168 hours. RADIOLOGY:  DG Knee 1-2 Views Right  Result Date: 01/04/2021 CLINICAL DATA:  Right patellar fracture. EXAM: RIGHT KNEE - 1-2 VIEW COMPARISON:  Preoperative right knee views 01/03/2021. FINDINGS: Fluoroscopy time 33 seconds. Dosing  information not available. Twelve sequential intraoperative spot fluoroscopic images are provided. Open reduction of the previously described comminuted patellar fracture was performed without the use of metal hardware. Grossly near anatomic alignment is seen on the final images. IMPRESSION: Grossly near anatomic post fracture reduction alignment on the spot fluoroscopic limited images. Electronically Signed   By: Almira Bar M.D.   On: 01/04/2021 20:47   CT KNEE RIGHT WO CONTRAST  Result Date: 01/04/2021 CLINICAL DATA:  RIGHT knee fracture last night. Preoperative imaging. EXAM: CT OF THE RIGHT KNEE WITHOUT CONTRAST TECHNIQUE: Multidetector CT imaging of the RIGHT knee  was performed according to the standard protocol. Multiplanar CT image reconstructions were also generated. COMPARISON:  Plain film of the RIGHT knee dated 01/03/2021. FINDINGS: Significantly displaced fracture of the RIGHT patella. The main fracture fragments are displaced approximately 5 cm. Lower pole fragment contains a comminuted fracture. No medial or lateral patellar displacement to suggest concomitant retinaculum tear. No additional fracture line identified within the distal femur, proximal tibia or proximal fibula. Anterior and posterior cruciate ligaments appear grossly intact. Small joint effusion. IMPRESSION: 1. Significantly displaced fracture of the RIGHT patella. The main fracture fragments are displaced approximately 5 cm. Lower pole fragment contains slightly displaced comminuted fracture lines. 2. No medial or lateral patellar displacement to suggest concomitant retinaculum tear. 3. Anterior and posterior cruciate ligaments appear intact and normal in alignment. 4. Small joint effusion. Electronically Signed   By: Bary Richard M.D.   On: 01/04/2021 10:45   DG Knee Complete 4 Views Right  Result Date: 01/03/2021 CLINICAL DATA:  Recent fall EXAM: RIGHT KNEE - COMPLETE 4+ VIEW COMPARISON:  None. FINDINGS: There is a transverse mildly comminuted fracture through the mid to lower portion of the patella. The fracture fragments are distracted by proximally 5.3 cm. No sizable joint effusion is seen. No other fracture or dislocation is noted. IMPRESSION: Comminuted mid to lower patellar fracture with distraction of the fracture fragments as described. Electronically Signed   By: Alcide Clever M.D.   On: 01/03/2021 22:19   DG Knee Right Port  Result Date: 01/04/2021 CLINICAL DATA:  Status post surgical pair of patellar fracture. EXAM: PORTABLE RIGHT KNEE - 1-2 VIEW COMPARISON:  January 03, 2021. FINDINGS: Postoperative change of open repair of the comminuted patellar fracture without evidence of  acute complication. Cutaneous skin staples. Subcutaneous gas and edema. External knee brace is in place. IMPRESSION: Postoperative change of open repair of the comminuted patellar fracture without evidence of acute complication. Electronically Signed   By: Maudry Mayhew M.D.   On: 01/04/2021 17:54   DG C-Arm 1-60 Min-No Report  Result Date: 01/04/2021 Fluoroscopy was utilized by the requesting physician.  No radiographic interpretation.   ASSESSMENT AND PLAN:  Wanda Price is a 56 y.o. Caucasian female with medical history significant for anxiety, depression and restless leg syndrome, as well as right breast cancer status post bilateral mastectomy, chemotherapy and radiotherapy, who presented to the ER with acute onset of mechanical fall landing on her right knee with subsequent severe right knee pain.  She stated that she slid on her knee.  Mechanical fall likely secondary to alcohol intoxication with subsequent right patellar fracture. - right knee with immobilizer  - Orthopedic consult with Dr. Ovid Curd for OR today - The patient has no history of coronary artery disease, CVA, CHF, renal failure with a creatinine more than 2 or diabetes mellitus on insulin.  Per the revised cardiac risk index the patient is considered  at average risk for her age for perioperative cardiovascular events.  She has no current pulmonary issues. --12/5-- ORIF right patellar fracture -- patient worked with physical therapy recommends home health PT OT. -- Dr. Martha Clan wants to observe patient one more night  Acute alcohol intoxication. - Alcohol level came back 308 --w/f WD. Pt reports she is social drinker. No s/o WD noted today   Anxiety and depression. - continue Celexa and trazodone.  History of breast cancer status post bilateral mastectomy, radiotherapy  --recently had reconstructive surgery at [facility]   DVT prophylaxis: SCDs.  Medical prophylaxis currently postponed pending postoperative  period. Code Status: full code.  Family Communication:  son  Level of care: Med-Surg Status is: Inpatient  Remains inpatient appropriate because: right patellar fracture        TOTAL TIME TAKING CARE OF THIS PATIENT: 25 minutes.  >50% time spent on counselling and coordination of care  Note: This dictation was prepared with Dragon dictation along with smaller phrase technology. Any transcriptional errors that result from this process are unintentional.  Enedina Finner M.D    [facility]   CC: Primary care physician; Ezekiel Slocumb, MD Patient ID: Wanda Price, female   DOB: [birth year], 55 y.o.   MRN: [id number]

## 2021-01-05 NOTE — Evaluation (Signed)
Physical Therapy Evaluation Patient Details Name: Wanda Price MRN: [id number] DOB: 10-01-1965 Today's Date: 01/05/2021  History of Present Illness  55yo female sustained a closed displaced and comminuted fracture of the R patella after a fall. Now s/p ORIF, toe touch weightbearing. PMHx includes anxiety, chronic UTI, depression, restless leg syndrome, breast cancer s/p bilateral  mastectomy on 05/2020 with recent L breast reconstruction, LUE NWBing per surgeon.    Clinical Impression  Pt sitting EOB with OT upon arrival to room.  Pt modI for all ADL's prior to surgical intervention requiring some assistance from friends/family members to obey lifting restrictions (>5lbs) in the LUE.  Pt living situation not favorable at this moment in time due to weight bearing precautions (NWB on LUE; TTWB on RLE).  Pt would best benefit from staying with mother who has level entry in order to get best care at this time.  She does not know if this is possible at this time due to cat being left at home and has not discussed with her mother at point of evaluation.    Pt requiring +2 CGA for safety during STS and SPT from EOB to recliner with hemi walker and cues for sequencing.  Pt able to transfer to recliner and participate in sitting exercises to prevent muscle atrophy in the region.  Pt notes an increase in pain following therapy, which is to be expected.  Nursing and case management notified of living situation as well as weight-bearing precautions from surgeon.  Pt will benefit from skilled PT intervention to increase independence and safety with basic mobility in preparation for discharge to the venue listed below.     Patient suffers from s/p ORIF of R patella and NWB on LUE following breast reconstruction (condition/diagnosis) which impairs his/her ability to perform daily activities like toileting, feeding, dressing, grooming, bathing in the home. A cane, walker, crutch will not resolve the patient's issue  with performing activities of daily living. A lightweight wheelchair and cushion is required/recommended and will allow patient to safely perform daily activities.   Patient can safely propel the wheelchair in the home or has a caregiver who can provide assistance.     Recommendations for follow up therapy are one component of a multi-disciplinary discharge planning process, led by the attending physician.  Recommendations may be updated based on patient status, additional functional criteria and insurance authorization.  Follow Up Recommendations Home health PT    Assistance Recommended at Discharge Frequent or constant Supervision/Assistance  Functional Status Assessment Patient has had a recent decline in their functional status and demonstrates the ability to make significant improvements in function in a reasonable and predictable amount of time.  Equipment Recommendations  BSC/3in1;Wheelchair (measurements PT)    Recommendations for Other Services       Precautions / Restrictions Precautions Precautions: Knee;Fall Required Braces or Orthoses: Knee Immobilizer - Right Knee Immobilizer - Right: On at all times Restrictions Weight Bearing Restrictions: Yes LUE Weight Bearing: Non weight bearing RLE Weight Bearing: Touchdown weight bearing      Mobility  Bed Mobility Overal bed mobility: Needs Assistance Bed Mobility: Supine to Sit     Supine to sit: Min assist;HOB elevated     General bed mobility comments: MIN A for RLE mgt    Transfers Overall transfer level: Needs assistance Equipment used: Hemi-walker Transfers: Sit to/from Stand;Bed to chair/wheelchair/BSC Sit to Stand: Min guard;+2 safety/equipment Stand pivot transfers: Min guard;+2 physical assistance;+2 safety/equipment         General  transfer comment: slow, pt maintains RLE NWBing with brace in place    Ambulation/Gait                  Stairs            Wheelchair Mobility     Modified Rankin (Stroke Patients Only)       Balance Overall balance assessment: Needs assistance;History of Falls Sitting-balance support: Feet supported;No upper extremity supported Sitting balance-Leahy Scale: Good     Standing balance support: Single extremity supported;During functional activity;Reliant on assistive device for balance Standing balance-Leahy Scale: Fair Standing balance comment: reliant on hemiwalker but no overt LOB                             Pertinent Vitals/Pain Pain Assessment: 0-10 Pain Score: 7  Pain Location: R knee Pain Descriptors / Indicators: Aching;Grimacing Pain Intervention(s): Limited activity within patient's tolerance;Monitored during session;Premedicated before session;Repositioned;Ice applied    Home Living Family/patient expects to be discharged to:: Private residence Living Arrangements: Alone Available Help at Discharge: Family;Friend(s);Available PRN/intermittently;Available 24 hours/day;Other (Comment) (pt reports she likely has someone who can stay with her 24/7 for a few days but not verified) Type of Home: Apartment Home Access: Stairs to enter Entrance Stairs-Rails: Right;Left Entrance Stairs-Number of Steps: 3 Alternate Level Stairs-Number of Steps: her apt is only on main floor Home Layout: Two level;Able to live on main level with bedroom/bathroom   Additional Comments: Pt reports mom's home is level entry, has walk-in shower, but mom has dog (pt's cat cannot stay there bc of dog)    Prior Function Prior Level of Function : Independent/Modified Independent               ADLs Comments: Pt required some assist for housework since surgery in November but was otherwise modified independent     Hand Dominance   Dominant Hand: Right    Extremity/Trunk Assessment   Upper Extremity Assessment Upper Extremity Assessment: LUE deficits/detail LUE Deficits / Details: NWBing per surgeon and shoulder flexion  to 90 max    Lower Extremity Assessment Lower Extremity Assessment: RLE deficits/detail RLE Deficits / Details: s/p sx, must remain in knee extension brace at all times, TTWBing RLE: Unable to fully assess due to immobilization;Unable to fully assess due to pain RLE Sensation: WNL       Communication   Communication: No difficulties  Cognition Arousal/Alertness: Awake/alert Behavior During Therapy: WFL for tasks assessed/performed Overall Cognitive Status: Within Functional Limits for tasks assessed                                          General Comments      Exercises Total Joint Exercises Ankle Circles/Pumps: AROM;Strengthening;Both;10 reps;Seated Quad Sets: AROM;Strengthening;Both;10 reps;Seated Gluteal Sets: AROM;Strengthening;Both;10 reps;Seated Hip ABduction/ADduction: AROM;Strengthening;Both;10 reps;Seated Straight Leg Raises: AROM;Strengthening;Both;10 reps;Seated Other Exercises Other Exercises: Pt instructed in precautions, polar care mgt, facilitated problem solving for ADL/mobility, encouraged to have mother bring shoe for L foot and consider whether she can stay with mom for a bit (no steps to enter, walk in shower) for additional support and ease of access since she has 3 steps. Pt instructed in ADL transfers + hemiwalker, able to keep RLE off the ground Other Exercises: Pt educated in roles of Pt and services provided during hospital stay.  Pt also edcuated on the  importance of exercise and maintaining good muscle control to prevent muscle atrophy s/p surgical intervention.   Assessment/Plan    PT Assessment Patient needs continued PT services  PT Problem List Decreased strength;Decreased range of motion;Decreased activity tolerance;Decreased balance;Decreased mobility;Pain       PT Treatment Interventions DME instruction;Gait training;Functional mobility training;Therapeutic activities;Therapeutic exercise;Balance training    PT Goals  (Current goals can be found in the Care Plan section)  Acute Rehab PT Goals Patient Stated Goal: to go home. PT Goal Formulation: With patient Time For Goal Achievement: 01/19/21 Potential to Achieve Goals: Good    Frequency BID   Barriers to discharge Inaccessible home environment;Decreased caregiver support Pt has friends/family willing to assist taking care of her, but not sure if they will be available 24/7.  Pt's living situation and stairs at apartment would be difficult to manage at this time as well.    Co-evaluation PT/OT/SLP Co-Evaluation/Treatment: Yes Reason for Co-Treatment: For patient/therapist safety;To address functional/ADL transfers PT goals addressed during session: Mobility/safety with mobility;Balance;Proper use of DME OT goals addressed during session: ADL's and self-care;Proper use of Adaptive equipment and DME       AM-PAC PT "6 Clicks" Mobility  Outcome Measure Help needed turning from your back to your side while in a flat bed without using bedrails?: None Help needed moving from lying on your back to sitting on the side of a flat bed without using bedrails?: None Help needed moving to and from a bed to a chair (including a wheelchair)?: A Lot Help needed standing up from a chair using your arms (e.g., wheelchair or bedside chair)?: A Little Help needed to walk in hospital room?: Total Help needed climbing 3-5 steps with a railing? : Total 6 Click Score: 15    End of Session Equipment Utilized During Treatment: Right knee immobilizer Activity Tolerance: Patient tolerated treatment well Patient left: in chair;with call bell/phone within reach;with chair alarm set Nurse Communication: Mobility status PT Visit Diagnosis: Unsteadiness on feet (R26.81);Muscle weakness (generalized) (M62.81);Difficulty in walking, not elsewhere classified (R26.2);Pain Pain - Right/Left: Right Pain - part of body: Knee    Time: 2426-8341 PT Time Calculation (min) (ACUTE  ONLY): 16 min   Charges:              Nolon Bussing, PT, DPT 01/05/21, 2:05 PM   Phineas Real 01/05/2021, 1:51 PM

## 2021-01-05 NOTE — Progress Notes (Signed)
Subjective:  POD #1 s/p ORIF of right comminuted patella fracture.   Patient reports right knee pain as moderate.  Patient was able to get out of bed today with PT and OT.  Objective:   VITALS:   Vitals:   01/04/21 2017 01/04/21 2325 01/05/21 0758 01/05/21 1129  BP: 138/80 (!) 146/83 125/71 129/69  Pulse: 79 87 81 96  Resp: 16 17 18 18   Temp: 98.7 F (37.1 C) 98.1 F (36.7 C) 98 F (36.7 C) 98.1 F (36.7 C)  TempSrc:      SpO2: 99% 94% 97% 99%  Weight:      Height:        PHYSICAL EXAM: Right lower extremity Neurovascular intact Sensation intact distally Intact pulses distally Dorsiflexion/Plantar flexion intact Incision: dressing C/D/I No cellulitis present Compartment soft  LABS  Results for orders placed or performed during the hospital encounter of 01/03/21 (from the past 24 hour(s))  CBC     Status: Abnormal   Collection Time: 01/04/21  6:10 PM  Result Value Ref Range   WBC 12.4 (H) 4.0 - 10.5 K/uL   RBC 3.54 (L) 3.87 - 5.11 MIL/uL   Hemoglobin 11.7 (L) 12.0 - 15.0 g/dL   HCT 14/04/22 (L) 89.3 - 81.0 %   MCV 95.8 80.0 - 100.0 fL   MCH 33.1 26.0 - 34.0 pg   MCHC 34.5 30.0 - 36.0 g/dL   RDW 17.5 10.2 - 58.5 %   Platelets 315 150 - 400 K/uL   nRBC 0.0 0.0 - 0.2 %  Creatinine, serum     Status: None   Collection Time: 01/04/21  6:10 PM  Result Value Ref Range   Creatinine, Ser 0.80 0.44 - 1.00 mg/dL   GFR, Estimated 14/04/22 >82 mL/min  CBC     Status: Abnormal   Collection Time: 01/05/21  4:54 AM  Result Value Ref Range   WBC 14.8 (H) 4.0 - 10.5 K/uL   RBC 3.36 (L) 3.87 - 5.11 MIL/uL   Hemoglobin 11.1 (L) 12.0 - 15.0 g/dL   HCT 14/05/22 (L) 35.3 - 61.4 %   MCV 94.6 80.0 - 100.0 fL   MCH 33.0 26.0 - 34.0 pg   MCHC 34.9 30.0 - 36.0 g/dL   RDW 43.1 54.0 - 08.6 %   Platelets 333 150 - 400 K/uL   nRBC 0.0 0.0 - 0.2 %  Basic metabolic panel     Status: Abnormal   Collection Time: 01/05/21  4:54 AM  Result Value Ref Range   Sodium 137 135 - 145 mmol/L    Potassium 4.6 3.5 - 5.1 mmol/L   Chloride 105 98 - 111 mmol/L   CO2 24 22 - 32 mmol/L   Glucose, Bld 142 (H) 70 - 99 mg/dL   BUN 10 6 - 20 mg/dL   Creatinine, Ser 14/05/22 0.44 - 1.00 mg/dL   Calcium 8.9 8.9 - 9.50 mg/dL   GFR, Estimated 93.2 >67 mL/min   Anion gap 8 5 - 15    DG Knee 1-2 Views Right  Result Date: 01/04/2021 CLINICAL DATA:  Right patellar fracture. EXAM: RIGHT KNEE - 1-2 VIEW COMPARISON:  Preoperative right knee views 01/03/2021. FINDINGS: Fluoroscopy time 33 seconds. Dosing information not available. Twelve sequential intraoperative spot fluoroscopic images are provided. Open reduction of the previously described comminuted patellar fracture was performed without the use of metal hardware. Grossly near anatomic alignment is seen on the final images. IMPRESSION: Grossly near anatomic post fracture reduction alignment on the spot  fluoroscopic limited images. Electronically Signed   By: Almira Bar M.D.   On: 01/04/2021 20:47   CT KNEE RIGHT WO CONTRAST  Result Date: 01/04/2021 CLINICAL DATA:  RIGHT knee fracture last night. Preoperative imaging. EXAM: CT OF THE RIGHT KNEE WITHOUT CONTRAST TECHNIQUE: Multidetector CT imaging of the RIGHT knee was performed according to the standard protocol. Multiplanar CT image reconstructions were also generated. COMPARISON:  Plain film of the RIGHT knee dated 01/03/2021. FINDINGS: Significantly displaced fracture of the RIGHT patella. The main fracture fragments are displaced approximately 5 cm. Lower pole fragment contains a comminuted fracture. No medial or lateral patellar displacement to suggest concomitant retinaculum tear. No additional fracture line identified within the distal femur, proximal tibia or proximal fibula. Anterior and posterior cruciate ligaments appear grossly intact. Small joint effusion. IMPRESSION: 1. Significantly displaced fracture of the RIGHT patella. The main fracture fragments are displaced approximately 5 cm. Lower  pole fragment contains slightly displaced comminuted fracture lines. 2. No medial or lateral patellar displacement to suggest concomitant retinaculum tear. 3. Anterior and posterior cruciate ligaments appear intact and normal in alignment. 4. Small joint effusion. Electronically Signed   By: Bary Richard M.D.   On: 01/04/2021 10:45   DG Knee Complete 4 Views Right  Result Date: 01/03/2021 CLINICAL DATA:  Recent fall EXAM: RIGHT KNEE - COMPLETE 4+ VIEW COMPARISON:  None. FINDINGS: There is a transverse mildly comminuted fracture through the mid to lower portion of the patella. The fracture fragments are distracted by proximally 5.3 cm. No sizable joint effusion is seen. No other fracture or dislocation is noted. IMPRESSION: Comminuted mid to lower patellar fracture with distraction of the fracture fragments as described. Electronically Signed   By: Alcide Clever M.D.   On: 01/03/2021 22:19   DG Knee Right Port  Result Date: 01/04/2021 CLINICAL DATA:  Status post surgical pair of patellar fracture. EXAM: PORTABLE RIGHT KNEE - 1-2 VIEW COMPARISON:  January 03, 2021. FINDINGS: Postoperative change of open repair of the comminuted patellar fracture without evidence of acute complication. Cutaneous skin staples. Subcutaneous gas and edema. External knee brace is in place. IMPRESSION: Postoperative change of open repair of the comminuted patellar fracture without evidence of acute complication. Electronically Signed   By: Maudry Mayhew M.D.   On: 01/04/2021 17:54   DG C-Arm 1-60 Min-No Report  Result Date: 01/04/2021 Fluoroscopy was utilized by the requesting physician.  No radiographic interpretation.    Assessment/Plan: 1 Day Post-Op   Principal Problem:   Right patella fracture Active Problems:   Alcoholic intoxication without complication (HCC)  The patient is stable post op.  I explained to the patient in detail what was done during surgery. Patient had fixation of her fracture with several #5  Fiberwire sutures in both a cerclage and figure of 8 configuration.  Patient is not able to weightbear on her left upper extremity after breast reconstruction per her surgeon.  She is using a hemiwalker and trying to avoid weightbearing on her right lower extremity.  Patient needs continued hospitalization for pain control and PT/OT.      Juanell Fairly , MD 01/05/2021, 1:44 PM

## 2021-01-05 NOTE — Evaluation (Signed)
Occupational Therapy Evaluation Patient Details Name: Wanda Price MRN: [id number] DOB: July 22, 1965 Today's Date: 01/05/2021   History of Present Illness 55yo female sustained a closed displaced and comminuted fracture of the R patella after a fall. Now s/p ORIF, toe touch weightbearing. PMHx includes anxiety, chronic UTI, depression, restless leg syndrome, breast cancer s/p bilateral  mastectomy on 05/2020 with recent L breast reconstruction, LUE NWBing per surgeon.   Clinical Impression   Pt seen for OT evaluation this date, POD#1 from above surgery. Pt was modified independent in all ADL prior to surgery except requiring some assist from family/friends for housekeeping tasks after recent L breast reconstruction with LUE NWBing and limited to 90* shoulder flexion max. Pt lives alone with her cat in a 2 story apartment (lives on the main floor) with tub/shower, and 3 steps to enter. Pt reports she has family and friends who can assist PRN and likely has someone who could be there 24/7 at discharge. Pt does endorse that her mom lives in a level entry home with a walk-in shower, but she has a dog who will not get along with her cat so she is unsure whether this would be a feasible discharge plan (TOC notified to investigate further). Pt presents with R knee pain, decreased RLE strength, and limited ROM in both RLE with knee extension brace in place and through LUE from previous surgery impacting her balance, safety/falls risk, and now requiring increased assist for LB ADL and ADL transfers as a result.   Pt is eager to return to PLOF with less pain and improved safety and independence. Pt currently requires MIN-MOD A for LB dressing and bathing while in seated position due to pain and LUE/RLE WBing restrictions. Pt instructed in polar care mgt, falls prevention strategies, home/routines modifications, DME/AE for LB bathing and dressing tasks, and ADL transfers with hemi walker. Pt required CGA and +2  for safety for STS and SPT from EOB to recliner with hemi walker and cues for sequencing. Pt able to maintain NWBing through RLE and no overt LOB. Pt would benefit from skilled OT services including additional instruction in dressing techniques with or without assistive devices for dressing and bathing skills to support recall and carryover prior to discharge and ultimately to maximize safety, independence, and minimize falls risk and caregiver burden. Pending confirmation of safe home set up and assist available at discharge, recommend HHOT services as well as a BSC, hemi walker, and wheelchair for household mobility/safety. Medical team notified.      Patient suffers from R knee ORIF now TTWBing with knee extension brace as well as LUE NWBing still in place from recent surgery which impairs her ability to perform daily activities like toileting, dressing, and bathing in the home. A hemi walker is appropriate for ADL transfers but will not resolve the patient's issue with performing activities of daily living and ability to negotiate home environment safely. A lightweight wheelchair and cushion is required/recommended and will allow patient to safely perform daily activities. Patient can safely propel the wheelchair in the home or has a caregiver who can provide assistance.   Recommendations for follow up therapy are one component of a multi-disciplinary discharge planning process, led by the attending physician.  Recommendations may be updated based on patient status, additional functional criteria and insurance authorization.   Follow Up Recommendations  Home health OT    Assistance Recommended at Discharge Intermittent Supervision/Assistance  Functional Status Assessment  Patient has had a recent decline in their  functional status and demonstrates the ability to make significant improvements in function in a reasonable and predictable amount of time.  Equipment Recommendations  BSC/3in1;Other  (comment) (hemi walker, wheelchair with elevating leg rest for RLE)    Recommendations for Other Services       Precautions / Restrictions Precautions Precautions: Knee;Fall Required Braces or Orthoses: Knee Immobilizer - Right Knee Immobilizer - Right: On at all times Restrictions Weight Bearing Restrictions: Yes LUE Weight Bearing: Non weight bearing RLE Weight Bearing: Touchdown weight bearing      Mobility Bed Mobility Overal bed mobility: Needs Assistance Bed Mobility: Supine to Sit     Supine to sit: Min assist;HOB elevated     General bed mobility comments: MIN A for RLE mgt    Transfers Overall transfer level: Needs assistance Equipment used: Hemi-walker Transfers: Sit to/from Stand;Bed to chair/wheelchair/BSC Sit to Stand: Min guard;+2 safety/equipment Stand pivot transfers: Min guard;+2 physical assistance;+2 safety/equipment         General transfer comment: slow, pt maintains RLE NWBing with brace in place      Balance Overall balance assessment: Needs assistance;History of Falls Sitting-balance support: Feet supported;No upper extremity supported Sitting balance-Leahy Scale: Good     Standing balance support: Single extremity supported;During functional activity;Reliant on assistive device for balance Standing balance-Leahy Scale: Fair Standing balance comment: reliant on hemiwalker but no overt LOB                           ADL either performed or assessed with clinical judgement   ADL Overall ADL's : Needs assistance/impaired                                       General ADL Comments: Pt requires MIN-MOD A for LB ADL tasks 2/2 RLE deficits, CGA + hemi walker for ADL transfers ( LUE NWBing, RLE TTWBing), MAX A for polar care mgt and knee extension brace mgt     Vision         Perception     Praxis      Pertinent Vitals/Pain Pain Assessment: 0-10 Pain Score: 7  Pain Location: R knee Pain Descriptors /  Indicators: Aching;Grimacing Pain Intervention(s): Limited activity within patient's tolerance;Monitored during session;Premedicated before session;Repositioned;Ice applied     Hand Dominance Right   Extremity/Trunk Assessment Upper Extremity Assessment Upper Extremity Assessment: LUE deficits/detail (RUE WFL) LUE Deficits / Details: NWBing per surgeon and shoulder flexion to 90 max   Lower Extremity Assessment Lower Extremity Assessment: RLE deficits/detail (LLE WBAT) RLE Deficits / Details: s/p sx, must remain in knee extension brace at all times, TTWBing RLE: Unable to fully assess due to immobilization;Unable to fully assess due to pain RLE Sensation: WNL       Communication Communication Communication: No difficulties   Cognition Arousal/Alertness: Awake/alert Behavior During Therapy: WFL for tasks assessed/performed Overall Cognitive Status: Within Functional Limits for tasks assessed                                       General Comments       Exercises Other Exercises Other Exercises: Pt instructed in precautions, polar care mgt, facilitated problem solving for ADL/mobility, encouraged to have mother bring shoe for L foot and consider whether she can stay with mom for  a bit (no steps to enter, walk in shower) for additional support and ease of access since she has 3 steps. Pt instructed in ADL transfers + hemiwalker, able to keep RLE off the ground   Shoulder Instructions      Home Living Family/patient expects to be discharged to:: Private residence Living Arrangements: Alone Available Help at Discharge: Family;Friend(s);Available PRN/intermittently;Available 24 hours/day;Other (Comment) (pt reports she likely has someone who can stay with her 24/7 for a few days but not verified) Type of Home: Apartment Home Access: Stairs to enter Entrance Stairs-Number of Steps: 3 Entrance Stairs-Rails: Right;Left Home Layout: Two level;Able to live on main  level with bedroom/bathroom Alternate Level Stairs-Number of Steps: her apt is only on main floor   Bathroom Shower/Tub: Chief Strategy Officer: Standard         Additional Comments: Pt reports mom's home is level entry, has walk-in shower, but mom has dog (pt's cat cannot stay there bc of dog)      Prior Functioning/Environment Prior Level of Function : Independent/Modified Independent               ADLs Comments: Pt required some assist for housework since surgery in November but was otherwise modified independent        OT Problem List: Decreased strength;Pain;Decreased range of motion;Impaired balance (sitting and/or standing);Decreased knowledge of use of DME or AE;Impaired UE functional use;Decreased knowledge of precautions      OT Treatment/Interventions: Self-care/ADL training;Therapeutic exercise;Therapeutic activities;DME and/or AE instruction;Patient/family education;Balance training    OT Goals(Current goals can be found in the care plan section) Acute Rehab OT Goals Patient Stated Goal: go back home and recover OT Goal Formulation: With patient Time For Goal Achievement: 01/19/21 Potential to Achieve Goals: Good ADL Goals Pt Will Perform Lower Body Dressing: with caregiver independent in assisting;with min assist;sitting/lateral leans Pt Will Transfer to Toilet: with modified independence;stand pivot transfer;bedside commode Pt Will Perform Toileting - Clothing Manipulation and hygiene: with modified independence;sitting/lateral leans Additional ADL Goal #1: Pt will independently instruct family/caregiver in K immobilizer/brace mgt Additional ADL Goal #2: Pt will independently instruct family/caregiver in polar care mgt  OT Frequency: Min 3X/week   Barriers to D/C: Inaccessible home environment          Co-evaluation PT/OT/SLP Co-Evaluation/Treatment: Yes Reason for Co-Treatment: For patient/therapist safety;To address functional/ADL  transfers PT goals addressed during session: Mobility/safety with mobility;Balance;Proper use of DME OT goals addressed during session: ADL's and self-care;Proper use of Adaptive equipment and DME      AM-PAC OT "6 Clicks" Daily Activity     Outcome Measure Help from another person eating meals?: None Help from another person taking care of personal grooming?: None Help from another person toileting, which includes using toliet, bedpan, or urinal?: A Little Help from another person bathing (including washing, rinsing, drying)?: A Little Help from another person to put on and taking off regular upper body clothing?: None Help from another person to put on and taking off regular lower body clothing?: A Lot 6 Click Score: 20   End of Session Equipment Utilized During Treatment: Other (comment) (hemiwalker, R knee extension brace) Nurse Communication: Mobility status;Weight bearing status  Activity Tolerance: Patient tolerated treatment well Patient left: in chair;with call bell/phone within reach;Other (comment) (with PT in room for additional treatment)  OT Visit Diagnosis: Other abnormalities of gait and mobility (R26.89);History of falling (Z91.81);Pain Pain - Right/Left: Right Pain - part of body: Knee  Time: 1610-9604 OT Time Calculation (min): 41 min Charges:  OT General Charges $OT Visit: 1 Visit OT Evaluation $OT Eval High Complexity: 1 High OT Treatments $Self Care/Home Management : 23-37 mins  Arman Filter., MPH, MS, OTR/L ascom [phone] 01/05/21, 1:04 PM

## 2021-01-06 ENCOUNTER — Inpatient Hospital Stay: Payer: 59

## 2021-01-06 MED ORDER — OXYCODONE HCL 5 MG PO TABS
5.0000 mg | ORAL_TABLET | Freq: Four times a day (QID) | ORAL | Status: DC | PRN
  Administered 2021-01-06 – 2021-01-07 (×4): 5 mg via ORAL
  Filled 2021-01-06 (×4): qty 1

## 2021-01-06 MED ORDER — IBUPROFEN 400 MG PO TABS
400.0000 mg | ORAL_TABLET | Freq: Four times a day (QID) | ORAL | Status: DC | PRN
  Administered 2021-01-06 – 2021-01-07 (×2): 400 mg via ORAL
  Filled 2021-01-06 (×2): qty 1

## 2021-01-06 MED ORDER — SODIUM CHLORIDE 0.9 % IV SOLN
12.5000 mg | Freq: Four times a day (QID) | INTRAVENOUS | Status: DC | PRN
  Administered 2021-01-06: 12.5 mg via INTRAVENOUS
  Filled 2021-01-06: qty 12.5

## 2021-01-06 MED ORDER — SCOPOLAMINE 1 MG/3DAYS TD PT72
1.0000 | MEDICATED_PATCH | TRANSDERMAL | Status: DC
  Administered 2021-01-06: 1.5 mg via TRANSDERMAL
  Filled 2021-01-06 (×2): qty 1

## 2021-01-06 NOTE — Progress Notes (Signed)
PT Cancellation Note  Patient Details Name: Wanda Price MRN: [id number] DOB: Jan 23, 1966   Cancelled Treatment:    Reason Eval/Treat Not Completed: Medical issues which prohibited therapy (2 atempts to see this afternoon- nauseated and vomitting first attempt, then mandibular quivering at second visit, pt reporting new low-grade fever. Will defer PT at this time, resume later date/time.)  3:54 PM, 01/06/21 Rosamaria Lints, PT, DPT Physical Therapist - [facility]  [phone] (ASCOM)   Wanda Price 01/06/2021, 3:54 PM

## 2021-01-06 NOTE — Progress Notes (Signed)
  Subjective:  POD #2 s/p ORIF of right comminuted patella fracture.   Patient reports right knee pain as moderate.  Patient had increased pain today.  She also experienced nausea and is recently received Phenergan.  Objective:   VITALS:   Vitals:   01/06/21 0440 01/06/21 0801 01/06/21 1136 01/06/21 1526  BP: 125/82 112/66 (!) 144/78 (!) 161/78  Pulse: 84 83 71 100  Resp: 17 18 18 18   Temp: 97.9 F (36.6 C) 98.2 F (36.8 C) 98.4 F (36.9 C) 99 F (37.2 C)  TempSrc:      SpO2: 100% 94% 100% 95%  Weight:      Height:        PHYSICAL EXAM: Right lower extremity Neurovascular intact Sensation intact distally Intact pulses distally Dorsiflexion/Plantar flexion intact No cellulitis present Compartment soft  LABS  No results found for this or any previous visit (from the past 24 hour(s)).  DG Knee 1-2 Views Right  Result Date: 01/06/2021 CLINICAL DATA:  Postoperative status. EXAM: RIGHT KNEE - 1-2 VIEW COMPARISON:  January 04, 2021. FINDINGS: There is been significant reduction involving comminuted and displaced patellar fracture compared to prior exam. Surgical staples are noted anteriorly. IMPRESSION: Significant reduction of comminuted and displaced patellar fracture compared to prior exam. Electronically Signed   By: January 06, 2021 M.D.   On: 01/06/2021 13:18    Assessment/Plan: 2 Days Post-Op   Principal Problem:   Right patella fracture Active Problems:   Alcoholic intoxication without complication (HCC)  I explained to the patient that it is not unusual to have fluctuating pain in the first several days after surgery.  I recommend she continue working with physical therapy.  Ideally the patient is toe-touch weightbearing only on the right lower extremity.  She has not supposed to bear weight on the left upper extremity due to recent breast reconstruction.  Given that the patient has upper and lower extremity limitations I recommended to the patient to consider going  to a skilled nursing facility.  The patient is going to consider it.  Will await further PT evaluation tomorrow.     14/07/2020 , MD 01/06/2021, 6:11 PM

## 2021-01-06 NOTE — TOC Initial Note (Signed)
Transition of Care [facility]) - Initial/Assessment Note    Patient Details  Name: DAURICE OVANDO MRN: [id number] Date of Birth: 05/01/1965  Transition of Care [facility]) CM/SW Contact:    Caryn Section, RN Phone Number: 01/06/2021, 5:15 PM  Clinical Narrative:      Patient lives at home alone.  She states she will have assistance from family and friends, as well as transportation.  She is able to obtain medications and take them as directed.  She is amenable to home health.  Thus far, [facility] has declined, RNCM searching for home health agencies, awaiting for responses.  TOC contact information provided, TOC to follow to discharge.             Expected Discharge Plan: Home w Home Health Services Barriers to Discharge: Continued Medical Work up   Patient Goals and CMS Choice        Expected Discharge Plan and Services Expected Discharge Plan: Home w Home Health Services   Discharge Planning Services: CM Consult Post Acute Care Choice: Durable Medical Equipment, Home Health Living arrangements for the past 2 months: Single Family Home                                      Prior Living Arrangements/Services Living arrangements for the past 2 months: Single Family Home Lives with:: Self Patient language and need for interpreter reviewed:: Yes (No interpreter required) Do you feel safe going back to the place where you live?: Yes      Need for Family Participation in Patient Care: Yes (Comment)     Criminal Activity/Legal Involvement Pertinent to Current Situation/Hospitalization: No - Comment as needed  Activities of Daily Living Home Assistive Devices/Equipment: None ADL Screening (condition at time of admission) Patient's cognitive ability adequate to safely complete daily activities?: Yes Is the patient deaf or have difficulty hearing?: No Does the patient have difficulty seeing, even when wearing glasses/contacts?: No Does the patient have difficulty concentrating,  remembering, or making decisions?: Yes Patient able to express need for assistance with ADLs?: Yes Does the patient have difficulty dressing or bathing?: No Independently performs ADLs?: Yes (appropriate for developmental age) Does the patient have difficulty walking or climbing stairs?: No Weakness of Legs: None Weakness of Arms/Hands: None  Permission Sought/Granted Permission sought to share information with : Case Manager Permission granted to share information with : Yes, Verbal Permission Granted     Permission granted to share info w AGENCY: home health agency        Emotional Assessment Appearance:: Appears stated age Attitude/Demeanor/Rapport: Lethargic Affect (typically observed): Quiet Orientation: : Oriented to Self, Oriented to Place, Oriented to  Time, Oriented to Situation Alcohol / Substance Use: Not Applicable Psych Involvement: No (comment)  Admission diagnosis:  Right patella fracture [S82.001A] Closed displaced longitudinal fracture of right patella, initial encounter [S82.021A] Alcoholic intoxication without complication [facility]) [F10.920] Patient Active Problem List   Diagnosis Date Noted   Alcoholic intoxication without complication (HCC)    Right patella fracture 01/03/2021   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 [place] [address] [place] [address] Phone: [phone] Fax: [phone]  [id number] - [place], [place] - [address]  [address] [place] [address] Phone: [phone] Fax: [phone]     Social Determinants of Health (SDOH) Interventions    Readmission Risk Interventions No flowsheet data found.

## 2021-01-06 NOTE — Progress Notes (Signed)
[facility]  - [place] at [facility]   PATIENT NAME: Wanda Price    MR#:  [id number]  DATE OF BIRTH:  September 02, 1965  SUBJECTIVE:  postop day 2 repair of patellar fracture no family at bedside.  C/o more pain today than yday. Worked with PT yday  REVIEW OF SYSTEMS:   Review of Systems  Constitutional:  Negative for chills, fever and weight loss.  HENT:  Negative for ear discharge, ear pain and nosebleeds.   Eyes:  Negative for blurred vision, pain and discharge.  Respiratory:  Negative for sputum production, shortness of breath, wheezing and stridor.   Cardiovascular:  Negative for chest pain, palpitations, orthopnea and PND.  Gastrointestinal:  Negative for abdominal pain, diarrhea, nausea and vomiting.  Genitourinary:  Negative for frequency and urgency.  Musculoskeletal:  Positive for falls and joint pain. Negative for back pain.  Neurological:  Positive for weakness. Negative for sensory change, speech change and focal weakness.  Psychiatric/Behavioral:  Negative for depression and hallucinations. The patient is not nervous/anxious.   Tolerating Diet:YES Tolerating PT: home health  DRUG ALLERGIES:   Allergies  Allergen Reactions   Desvenlafaxine Shortness Of Breath   Abilify [Aripiprazole] Other (See Comments)    Facial Disturbance   Codeine    Macrobid [Nitrofurantoin Monohyd Macro] Other (See Comments)    Severe headache and nausea   Wellbutrin [Bupropion] Other (See Comments)    Mood Changes   Zoloft [Sertraline Hcl] Other (See Comments)    Wired, unable to sleep   Bacitracin Rash   Neomycin Rash   Polymyxin B Rash    VITALS:  Blood pressure 112/66, pulse 83, temperature 98.2 F (36.8 C), resp. rate 18, height 5' 1.5" (1.562 m), weight 54 kg, SpO2 94 %.  PHYSICAL EXAMINATION:   Physical Exam  GENERAL:  55 y.o.-year-old patient lying in the bed with no acute distress.  HEENT: Head atraumatic, normocephalic. Oropharynx and nasopharynx  clear.  LUNGS: Normal breath sounds bilaterally, no wheezing, rales, rhonchi. No use of accessory muscles of respiration.  CARDIOVASCULAR: S1, S2 normal. No murmurs, rubs, or gallops.  ABDOMEN: Soft, nontender, nondistended. Bowel sounds present. No organomegaly or mass.  EXTREMITIES: right knee surgical dressing and brace NEUROLOGIC: nonfocal PSYCHIATRIC:  patient is alert and oriented x 3.  SKIN: No obvious rash, lesion, or ulcer.   LABORATORY PANEL:  CBC Recent Labs  Lab 01/05/21 0454  WBC 14.8*  HGB 11.1*  HCT 31.8*  PLT 333     Chemistries  Recent Labs  Lab 01/03/21 2141 01/04/21 0611 01/05/21 0454  NA 133*   < > 137  K 3.7   < > 4.6  CL 99   < > 105  CO2 25   < > 24  GLUCOSE 106*   < > 142*  BUN 14   < > 10  CREATININE 0.85   < > 0.73  CALCIUM 9.0   < > 8.9  AST 95*  --   --   ALT 119*  --   --   ALKPHOS 318*  --   --   BILITOT 0.5  --   --    < > = values in this interval not displayed.    Cardiac Enzymes No results for input(s): TROPONINI in the last 168 hours. RADIOLOGY:  DG Knee 1-2 Views Right  Result Date: 01/04/2021 CLINICAL DATA:  Right patellar fracture. EXAM: RIGHT KNEE - 1-2 VIEW COMPARISON:  Preoperative right knee views 01/03/2021. FINDINGS: Fluoroscopy time 33 seconds.  Dosing information not available. Twelve sequential intraoperative spot fluoroscopic images are provided. Open reduction of the previously described comminuted patellar fracture was performed without the use of metal hardware. Grossly near anatomic alignment is seen on the final images. IMPRESSION: Grossly near anatomic post fracture reduction alignment on the spot fluoroscopic limited images. Electronically Signed   By: Almira Bar M.D.   On: 01/04/2021 20:47   DG Knee Right Port  Result Date: 01/04/2021 CLINICAL DATA:  Status post surgical pair of patellar fracture. EXAM: PORTABLE RIGHT KNEE - 1-2 VIEW COMPARISON:  January 03, 2021. FINDINGS: Postoperative change of open  repair of the comminuted patellar fracture without evidence of acute complication. Cutaneous skin staples. Subcutaneous gas and edema. External knee brace is in place. IMPRESSION: Postoperative change of open repair of the comminuted patellar fracture without evidence of acute complication. Electronically Signed   By: Maudry Mayhew M.D.   On: 01/04/2021 17:54   DG C-Arm 1-60 Min-No Report  Result Date: 01/04/2021 Fluoroscopy was utilized by the requesting physician.  No radiographic interpretation.   ASSESSMENT AND PLAN:  Wanda Price is a 55 y.o. Caucasian female with medical history significant for anxiety, depression and restless leg syndrome, as well as right breast cancer status post bilateral mastectomy, chemotherapy and radiotherapy, who presented to the ER with acute onset of mechanical fall landing on her right knee with subsequent severe right knee pain.  She stated that she slid on her knee.  Mechanical fall likely secondary to alcohol intoxication with subsequent right patellar fracture. - right knee with immobilizer  - Orthopedic consult with Dr. Ovid Curd for OR today - The patient has no history of coronary artery disease, CVA, CHF, renal failure with a creatinine more than 2 or diabetes mellitus on insulin.  Per the revised cardiac risk index the patient is considered at average risk for her age for perioperative cardiovascular events.  She has no current pulmonary issues. --12/5-- ORIF right patellar fracture -- patient worked with physical therapy recommends home health PT OT. --12/6--pt c/o more pain. Dr Real Cons to see. Pt worked with PT yday  Acute alcohol intoxication. - Alcohol level came back 308 --w/f WD. Pt reports she is social drinker. No s/o WD noted today   Anxiety and depression. - continue Celexa and trazodone.  History of breast cancer status post bilateral mastectomy, radiotherapy  --recently had reconstructive surgery at [facility]   DVT prophylaxis:  enoxaparin Code Status: full code.  Family Communication:  none today  Level of care: Med-Surg Status is: Inpatient  Remains inpatient appropriate because: right patellar fracture        TOTAL TIME TAKING CARE OF THIS PATIENT: 25 minutes.  >50% time spent on counselling and coordination of care  Note: This dictation was prepared with Dragon dictation along with smaller phrase technology. Any transcriptional errors that result from this process are unintentional.  Enedina Finner M.D    [facility]   CC: Primary care physician; Ezekiel Slocumb, MD Patient ID: Ronalee Belts, female   DOB: 09/11/1965, 55 y.o.   MRN: [id number]

## 2021-01-06 NOTE — Progress Notes (Signed)
Occupational Therapy Treatment Patient Details Name: Wanda Price MRN: [id number] DOB: 02/17/1965 Today's Date: 01/06/2021   History of present illness 55yo female sustained a closed displaced and comminuted fracture of the R patella after a fall. Now s/p ORIF, toe touch weightbearing. PMHx includes anxiety, chronic UTI, depression, restless leg syndrome, breast cancer s/p bilateral  mastectomy on 05/2020 with recent L breast reconstruction, LUE NWBing per surgeon.   OT comments  Pt seen for OT tx. Pt in recliner, endorsing severe nausea and 10/10 R knee pain. Pt shares with therapist that she feels like she "over did it" while bathing herself the evening before and has been unable to keep the R knee pain under control since then. Pt provided with cool damp washcloths. Pt educated in RLE positioning and knee extension brace application and positioning for optimal joint alignment and comfort. OT assisted in supporting RLE during brace doffing/donning to stabilize knee and minimize any movement in order for Xray tech to take imaging after tech was unable to take an image with the brace and polar care on. Pt educated in cognitive behavioral pain coping strategies to promote improved self mgt of pain. RN also notified of pt's pain and nausea. Pt educated in sequencing for [facility] transfer but ultimately pt too pain limited and nauseated to safely attempt to promptly get to [facility] to urinate. Resorted to one time use of purewick for toileting needs due to urgency. Pt left in recliner all needs in reach. Pt very pain limited this date. Anticipate improvement in ADL mobility with improved pain control..    Recommendations for follow up therapy are one component of a multi-disciplinary discharge planning process, led by the attending physician.  Recommendations may be updated based on patient status, additional functional criteria and insurance authorization.    Follow Up Recommendations  Home health OT     Assistance Recommended at Discharge Intermittent Supervision/Assistance  Equipment Recommendations  BSC/3in1;Other (comment) (hemi walker, wheelchair with elevating leg rest for RLE)    Recommendations for Other Services      Precautions / Restrictions Precautions Precautions: Knee;Fall Required Braces or Orthoses: Knee Immobilizer - Right Knee Immobilizer - Right: On at all times Restrictions Weight Bearing Restrictions: Yes LUE Weight Bearing: Non weight bearing RLE Weight Bearing: Touchdown weight bearing       Mobility Bed Mobility               General bed mobility comments: NT, up in recliner    Transfers                   General transfer comment: prepared to attempt to stand from recliner but ultimately was too nauseated and had too much pain to safely attempt     Balance                                           ADL either performed or assessed with clinical judgement   ADL                             Toilet Transfer Details (indicate cue type and reason): deferred 2/2 10/10 R knee pain and severe nausea and whooziness/loopy, purewick utilized also in part due to urgency                Extremity/Trunk Assessment  Vision       Perception     Praxis      Cognition Arousal/Alertness: Awake/alert;Suspect due to medications Behavior During Therapy: Restless Overall Cognitive Status: Within Functional Limits for tasks assessed                                 General Comments: restless from severe pain and nausea, endorses feeling "loopy" and "not myself", likely due to pain medication          Exercises Other Exercises Other Exercises: Pt educated in RLE positioning and knee extension brace application and positioning for optimal joint alignment and comfort. Other Exercises: OT assisted in supporting RLE during brace doffing/donning to stabilize knee and minimize any  movement in order for Xray tech to take imaging after tech was unable to take an image with the brace and polar care on   Shoulder Instructions       General Comments      Pertinent Vitals/ Pain       Pain Assessment: 0-10 Pain Score: 10-Worst pain ever Pain Location: R knee Pain Descriptors / Indicators: Aching;Grimacing Pain Intervention(s): Limited activity within patient's tolerance;Monitored during session;Premedicated before session;Repositioned;Ice applied;Patient requesting pain meds-RN notified;Utilized relaxation techniques;Relaxation  Home Living                                          Prior Functioning/Environment              Frequency  Min 3X/week        Progress Toward Goals  OT Goals(current goals can now be found in the care plan section)  Progress towards OT goals: Not progressing toward goals - comment (too pain limited and nausea)  Acute Rehab OT Goals Patient Stated Goal: go home and recover OT Goal Formulation: With patient Time For Goal Achievement: 01/19/21 Potential to Achieve Goals: Good  Plan Discharge plan remains appropriate;Frequency remains appropriate    Co-evaluation                 AM-PAC OT "6 Clicks" Daily Activity     Outcome Measure   Help from another person eating meals?: None Help from another person taking care of personal grooming?: None Help from another person toileting, which includes using toliet, bedpan, or urinal?: A Little Help from another person bathing (including washing, rinsing, drying)?: A Little Help from another person to put on and taking off regular upper body clothing?: None Help from another person to put on and taking off regular lower body clothing?: A Lot 6 Click Score: 20    End of Session    OT Visit Diagnosis: Other abnormalities of gait and mobility (R26.89);History of falling (Z91.81);Pain Pain - Right/Left: Right Pain - part of body: Knee   Activity  Tolerance Patient limited by pain;Other (comment) (nausea)   Patient Left in chair;with call bell/phone within reach   Nurse Communication Patient requests pain meds        Time: 0370-4888 OT Time Calculation (min): 59 min  Charges: OT General Charges $OT Visit: 1 Visit OT Treatments $Self Care/Home Management : 38-52 mins  Arman Filter., MPH, MS, OTR/L ascom [phone] 01/06/21, 12:45 PM

## 2021-01-06 NOTE — Progress Notes (Signed)
Physical Therapy Treatment Patient Details Name: Wanda Price MRN: [id number] DOB: 09-17-1965 Today's Date: 01/06/2021   History of Present Illness 55yo female sustained a closed displaced and comminuted fracture of the R patella after a fall. Now s/p ORIF, toe touch weightbearing. PMHx includes anxiety, chronic UTI, depression, restless leg syndrome, breast cancer s/p bilateral  mastectomy on 05/2020 with recent L breast reconstruction, LUE NWBing per surgeon.   PT Comments    Pt was anxious but willing to participate during the session and put forth decent effort throughout. Pt reported being in 9/10 pain and feeling nauseous at the beginning of the session. Pt was clearly upset about situation and interactions with nursing and PT tried to console her. Pt was able to complete all ther ex in bed with min assist for RLE hip abd/add. Pt was able to complete bed mobility with min assist for RLE due to pain and weakness. Pt was tearful to movement and needed rest breaks in between. Pt was able to sit EOB with good balance and RUE support. Pt showed impulsive behavior due to performing STS before PT was ready with recliner locked, AD in place, and gait belt on. Although, pt demonstrated adequate strength in LLE to hold her up and perform transfer to recliner. Pt felt nauseous immediately following transfer and began to vomit x2, which she suspects is from medication. BP was checked and it was slightly elevated, which PT believes is due to pain and situation. Due to living situation, available assistance at d/c, WB restrictions, and stairs to enter house, ability for pt to safely manage in the home is guarded, PT recommends per progress and physician's recommendations.      Recommendations for follow up therapy are one component of a multi-disciplinary discharge planning process, led by the attending physician.  Recommendations may be updated based on patient status, additional functional criteria and  insurance authorization.  Follow Up Recommendations  Follow physician's recommendations for discharge plan and follow up therapies     Assistance Recommended at Discharge Frequent or constant Supervision/Assistance  Equipment Recommendations  BSC/3in1;Wheelchair (measurements PT)    Recommendations for Other Services       Precautions / Restrictions Precautions Precautions: Knee;Fall Required Braces or Orthoses: Knee Immobilizer - Right Knee Immobilizer - Right: On at all times Restrictions Weight Bearing Restrictions: Yes LUE Weight Bearing: Non weight bearing RLE Weight Bearing: Touchdown weight bearing     Mobility  Bed Mobility Overal bed mobility: Needs Assistance Bed Mobility: Supine to Sit     Supine to sit: Min assist;HOB elevated     General bed mobility comments: Min assist with RLE control due to pain and weakness, despite cuing for NWB restriction reminder on LUE pt continued to use LUE with no pain    Transfers Overall transfer level: Needs assistance Equipment used: Hemi-walker Transfers: Sit to/from Stand Sit to Stand: Min guard           General transfer comment: Pt was able to complete STS with min assist, but impulsively did so before PT told pt they were cleared to STS    Ambulation/Gait Ambulation/Gait assistance: Min guard Gait Distance (Feet): 3 Feet Assistive device: Hemi-walker Gait Pattern/deviations: Trunk flexed;Shuffle Gait velocity: decreased     General Gait Details: Pt took 2-3 hops to turn and sit in recliner with min guard for safety, but pt was impulsive and did not wait for PT to clear pt for gait activity   Stairs  Wheelchair Mobility    Modified Rankin (Stroke Patients Only)       Balance Overall balance assessment: Needs assistance;History of Falls Sitting-balance support: Feet supported;No upper extremity supported Sitting balance-Leahy Scale: Good     Standing balance support: Single  extremity supported;During functional activity;Reliant on assistive device for balance Standing balance-Leahy Scale: Fair Standing balance comment: reliant on hemiwalker but no overt LOB, demonstrated adequate strength in LLE to hold pt up during transfers                            Cognition Arousal/Alertness: Awake/alert Behavior During Therapy: Impulsive;Anxious Overall Cognitive Status: Within Functional Limits for tasks assessed                                 General Comments: restless from severe pain and nausea, endorses feeling "loopy" and "not myself", likely due to pain medication        Exercises Total Joint Exercises Ankle Circles/Pumps: AROM;Strengthening;Both;10 reps;Supine Quad Sets: AROM;Strengthening;Both;10 reps;Supine Hip ABduction/ADduction: AAROM;Strengthening;Both;10 reps;Supine Other Exercises Other Exercises: Pt education on importance of safe transfers    General Comments        Pertinent Vitals/Pain Pain Assessment: 0-10 Pain Score: 9  Pain Location: R knee Pain Descriptors / Indicators: Aching;Grimacing;Crying Pain Intervention(s): Limited activity within patient's tolerance;Monitored during session;Repositioned    Home Living                          Prior Function            PT Goals (current goals can now be found in the care plan section) Acute Rehab PT Goals Patient Stated Goal: to go home. PT Goal Formulation: With patient Time For Goal Achievement: 01/19/21 Potential to Achieve Goals: Good Progress towards PT goals: Progressing toward goals    Frequency    BID      PT Plan Discharge plan needs to be updated    Co-evaluation              AM-PAC PT "6 Clicks" Mobility   Outcome Measure  Help needed turning from your back to your side while in a flat bed without using bedrails?: None Help needed moving from lying on your back to sitting on the side of a flat bed without using  bedrails?: A Little Help needed moving to and from a bed to a chair (including a wheelchair)?: A Little Help needed standing up from a chair using your arms (e.g., wheelchair or bedside chair)?: A Little Help needed to walk in hospital room?: A Lot Help needed climbing 3-5 steps with a railing? : Total 6 Click Score: 16    End of Session Equipment Utilized During Treatment: Right knee immobilizer Activity Tolerance: Patient tolerated treatment well Patient left: in chair;with call bell/phone within reach;with chair alarm set;Other (comment) (with polar ice reapplied) Nurse Communication: Mobility status PT Visit Diagnosis: Unsteadiness on feet (R26.81);Muscle weakness (generalized) (M62.81);Difficulty in walking, not elsewhere classified (R26.2);Pain Pain - Right/Left: Right Pain - part of body: Knee     Time: 9604-5409 PT Time Calculation (min) (ACUTE ONLY): 48 min  Charges:                        Hildred Alamin SPT 01/06/21, 2:16 PM

## 2021-01-07 DIAGNOSIS — S86812A Strain of other muscle(s) and tendon(s) at lower leg level, left leg, initial encounter: Secondary | ICD-10-CM

## 2021-01-07 MED ORDER — ONDANSETRON HCL 4 MG PO TABS: 4.0000 mg | ORAL_TABLET | Freq: Two times a day (BID) | ORAL | 0 refills | Status: AC | PRN

## 2021-01-07 MED ORDER — IBUPROFEN 400 MG PO TABS: 400.0000 mg | ORAL_TABLET | Freq: Four times a day (QID) | ORAL | 0 refills | Status: AC | PRN

## 2021-01-07 MED ORDER — ACETAMINOPHEN 325 MG PO TABS: 650.0000 mg | ORAL_TABLET | Freq: Four times a day (QID) | ORAL | 0 refills | Status: AC | PRN

## 2021-01-07 MED ORDER — HYDROMORPHONE HCL 2 MG PO TABS: 1.0000 mg | ORAL_TABLET | Freq: Four times a day (QID) | ORAL | 0 refills | Status: AC | PRN

## 2021-01-07 NOTE — Progress Notes (Incomplete)
Physical Therapy Treatment Patient Details Name: Wanda Price MRN: [id number] DOB: 01/15/1966 Today's Date: 01/07/2021   History of Present Illness 55yo female sustained a closed displaced and comminuted fracture of the R patella after a fall. Now s/p ORIF,TTWB. PMHx includes anxiety, chronic UTI, depression, restless leg syndrome, breast cancer s/p bilateral mastectomy on 05/2020 with recent L breast reconstruction, LUE NWBing per surgeon (5-10lb restriction).    PT Comments    ***   Recommendations for follow up therapy are one component of a multi-disciplinary discharge planning process, led by the attending physician.  Recommendations may be updated based on patient status, additional functional criteria and insurance authorization.  Follow Up Recommendations  Follow physician's recommendations for discharge plan and follow up therapies     Assistance Recommended at Discharge Frequent or constant Supervision/Assistance  Equipment Recommendations  Wheelchair (measurements PT);BSC/3in1 (elevated leg rest right.)    Recommendations for Other Services       Precautions / Restrictions Precautions Precautions: Knee;Fall Required Braces or Orthoses: Knee Immobilizer - Right Knee Immobilizer - Right: On at all times Restrictions Weight Bearing Restrictions: Yes RUE Weight Bearing: Weight bearing as tolerated LUE Weight Bearing: Non weight bearing (5-10lb weight restriction) RLE Weight Bearing: Weight bearing as tolerated     Mobility  Bed Mobility               General bed mobility comments: in recliner at entry    Transfers Overall transfer level: Needs assistance Equipment used: Pushed w/c Transfers: Bed to chair/wheelchair/BSC Sit to Stand: Min guard Stand pivot transfers: Min guard;+2 physical assistance;+2 safety/equipment Squat pivot transfers: Supervision       General transfer comment: recliner to WC left; WC to recliner right; successful performance  of TTWB or NWB when up    Ambulation/Gait                   Stairs Stairs: Yes Stairs assistance: Total assist;+2 safety/equipment;+2 physical assistance Stair Management: Wheelchair Number of Stairs: 8 General stair comments: Educated [place] and then [place] on performance for home DC. Pennie Rushing learned from both bottom and top of WC; Kathlene November performed from top of WC only; Pt calm throughout.   Merchant navy officer mobility: Yes Wheelchair propulsion: Both upper extremities;Left lower extremity Wheelchair parts: Supervision/cueing Distance: to stairwell 161ft, then back to room 181ft;  Modified Rankin (Stroke Patients Only)       Balance Overall balance assessment: Modified Independent                                          Cognition Arousal/Alertness: Awake/alert Behavior During Therapy: WFL for tasks assessed/performed Overall Cognitive Status: Within Functional Limits for tasks assessed                                          Exercises Other Exercises Other Exercises: STS from recliner, guest-chairback for support ad lib, sustained standing x30sec, trial of RLE TTWB from NWB a few times, but pain is more intense    General Comments        Pertinent Vitals/Pain Pain Assessment: 0-10 Pain Score: 2  (more pain with mobility, but remains motivated and well paced) Pain Location: R knee Pain Descriptors / Indicators: Aching;Sore;Grimacing Pain Intervention(s): Limited activity within patient's tolerance;Monitored during  session;Premedicated before session    Home Living                          Prior Function            PT Goals (current goals can now be found in the care plan section) Acute Rehab PT Goals Patient Stated Goal: to go home. PT Goal Formulation: With patient Time For Goal Achievement: 01/19/21 Potential to Achieve Goals: Good Progress towards PT goals: Progressing  toward goals    Frequency    BID      PT Plan Discharge plan needs to be updated    Co-evaluation              AM-PAC PT "6 Clicks" Mobility   Outcome Measure  Help needed turning from your back to your side while in a flat bed without using bedrails?: A Little Help needed moving from lying on your back to sitting on the side of a flat bed without using bedrails?: A Little Help needed moving to and from a bed to a chair (including a wheelchair)?: A Little Help needed standing up from a chair using your arms (e.g., wheelchair or bedside chair)?: A Little Help needed to walk in hospital room?: Total Help needed climbing 3-5 steps with a railing? : Total 6 Click Score: 14    End of Session Equipment Utilized During Treatment: Right knee immobilizer Activity Tolerance: Patient tolerated treatment well Patient left: in chair;with nursing/sitter in room;with family/visitor present Nurse Communication: Mobility status PT Visit Diagnosis: Unsteadiness on feet (R26.81);Muscle weakness (generalized) (M62.81);Difficulty in walking, not elsewhere classified (R26.2);Pain Pain - Right/Left: Right Pain - part of body: Knee     Time: 1445-1530 PT Time Calculation (min) (ACUTE ONLY): 45 min  Charges:  $Therapeutic Activity: 8-22 mins $Self Care/Home Management: 23-37                     {enter signature dotphrase here}   Brier Reid C 01/07/2021, 3:39 PM

## 2021-01-07 NOTE — Progress Notes (Signed)
Physical Therapy Treatment Patient Details Name: Wanda Price MRN: [id number] DOB: 05/05/1965 Today's Date: 01/07/2021   History of Present Illness 55yo female sustained a closed displaced and comminuted fracture of the R patella after a fall. Now s/p ORIF,TTWB. PMHx includes anxiety, chronic UTI, depression, restless leg syndrome, breast cancer s/p bilateral mastectomy on 05/2020 with recent L breast reconstruction, LUE NWBing per surgeon.    PT Comments    Pt in bed upon arrival, asks to discuss in depth DC to home, really not sure if going to STR is best option for her. Pt reports heavy social support, adequate access to place to sleep and BSC for remote ADL performance. Pt understands she will need a WC regardless, that her entry stairs present a hurdle, but has caregivers/friends who can assist with entry/exit for medical FU. Pt performs bed mobility with modIndep, squat pivot bed to WC at supervision with teaching cues, self propulsion in WC. Pt educated on WC locks and elevated leg rest. Pt will coordinate visitation from those who will need education WC transfers up stairs for DC. Pt left in hallway in [facility] with OT.      Recommendations for follow up therapy are one component of a multi-disciplinary discharge planning process, led by the attending physician.  Recommendations may be updated based on patient status, additional functional criteria and insurance authorization.  Follow Up Recommendations  Follow physician's recommendations for discharge plan and follow up therapies     Assistance Recommended at Discharge Frequent or constant Supervision/Assistance  Equipment Recommendations  Wheelchair (measurements PT)    Recommendations for Other Services       Precautions / Restrictions Precautions Precautions: Knee;Fall Required Braces or Orthoses: Knee Immobilizer - Right Knee Immobilizer - Right: On at all times Restrictions Weight Bearing Restrictions: Yes RUE Weight  Bearing: Weight bearing as tolerated (per plastic surgery) LUE Weight Bearing: Non weight bearing (5-10lbs max if needed per plastic surgery) RLE Weight Bearing: Weight bearing as tolerated     Mobility  Bed Mobility Overal bed mobility: Needs Assistance Bed Mobility: Supine to Sit     Supine to sit: Modified independent (Device/Increase time)          Transfers Overall transfer level: Needs assistance Equipment used: Pushed w/c Transfers: Bed to chair/wheelchair/BSC     Squat pivot transfers: Supervision       General transfer comment: EOB to WC right; entralized Rt foot to BOS, the RUE reach to far WC arm rest: squat pivot    Ambulation/Gait                   Psychologist, counselling mobility: Yes Wheelchair propulsion: Both upper extremities;Left lower extremity Wheelchair parts: Supervision/cueing (educated on WC locks; educated on removal and setup of Rt elevated leg rest.) Distance: 100; then left in hallway with OT t continue with navigation. Wheelchair Assistance Details (indicate cue type and reason): retro propulsion, forward propulsion, turning  Modified Rankin (Stroke Patients Only)       Balance Overall balance assessment: No apparent balance deficits (not formally assessed);Modified Independent   Sitting balance-Leahy Scale: Normal                                      Cognition Arousal/Alertness: Awake/alert Behavior During Therapy: WFL for tasks assessed/performed Overall Cognitive Status: Within Functional  Limits for tasks assessed                                          Exercises      General Comments        Pertinent Vitals/Pain Pain Score: 2  Pain Location: R knee Pain Descriptors / Indicators: Aching;Grimacing;Crying Pain Intervention(s): Limited activity within patient's tolerance;Monitored during session;Premedicated before session     Home Living                          Prior Function            PT Goals (current goals can now be found in the care plan section) Acute Rehab PT Goals Patient Stated Goal: to go home. PT Goal Formulation: With patient Time For Goal Achievement: 01/19/21 Potential to Achieve Goals: Good Progress towards PT goals: Progressing toward goals    Frequency    BID      PT Plan Discharge plan needs to be updated    Co-evaluation              AM-PAC PT "6 Clicks" Mobility   Outcome Measure  Help needed turning from your back to your side while in a flat bed without using bedrails?: A Little Help needed moving from lying on your back to sitting on the side of a flat bed without using bedrails?: A Little Help needed moving to and from a bed to a chair (including a wheelchair)?: A Little Help needed standing up from a chair using your arms (e.g., wheelchair or bedside chair)?: A Little Help needed to walk in hospital room?: Total Help needed climbing 3-5 steps with a railing? : Total 6 Click Score: 14    End of Session Equipment Utilized During Treatment: Right knee immobilizer Activity Tolerance: Patient tolerated treatment well Patient left: in chair;with nursing/sitter in room Nurse Communication: Mobility status PT Visit Diagnosis: Unsteadiness on feet (R26.81);Muscle weakness (generalized) (M62.81);Difficulty in walking, not elsewhere classified (R26.2);Pain Pain - Right/Left: Right Pain - part of body: Knee     Time: 0926-1017 PT Time Calculation (min) (ACUTE ONLY): 51 min  Charges:  $Gait Training: 8-22 mins $Therapeutic Exercise: 8-22 mins $Self Care/Home Management: 8-22                    11:10 AM, 01/07/21 Rosamaria Lints, PT, DPT Physical Therapist - [facility]  [phone] (ASCOM)    Yoniel Arkwright C 01/07/2021, 11:07 AM

## 2021-01-07 NOTE — Progress Notes (Incomplete)
PROGRESS NOTE  Wanda Price    DOB: [birth year], 55 y.o.  IOE:[id number]  PCP: Ezekiel Slocumb, MD   Code Status: Full Code   DOA: 01/03/2021   LOS: 4  Brief Narrative of Current Hospitalization  Wanda Price is a 55 y.o. female with a PMH significant for ***. They presented from *** to the ED on 01/03/2021 with *** x***days. In the ED, it was found that they had ***. They were treated with ***.  Patient was admitted to medicine service for further workup and management of *** as outlined in detail below.  01/07/21 -***  Assessment & Plan  Principal Problem:   Right patella fracture Active Problems:   Alcoholic intoxication without complication (HCC)   *** -   *** -   *** -   *** -   *** -   DVT prophylaxis: enoxaparin (LOVENOX) injection 40 mg Start: 01/05/21 0800 Place TED hose Start: 01/04/21 1750 Foot Pump / plexipulse Start: 01/04/21 1750   Diet:  Diet Orders (From admission, onward)     Start     Ordered   01/04/21 1750  Diet regular Room service appropriate? Yes; Fluid consistency: Thin  Diet effective now       Question Answer Comment  Room service appropriate? Yes   Fluid consistency: Thin      01/04/21 1750            Subjective 01/07/21    Pt reports ***  Disposition Plan & Communication  Patient status: Inpatient  Admitted From: {From:23814} Disposition: {PLAN; DISPOSITION:26386} Anticipated discharge date: ***  Family Communication: ***  Consults, Procedures, Significant Events  Consultants:  ***  Procedures/significant events:  *** Antimicrobials:  Anti-infectives (From admission, onward)    Start     Dose/Rate Route Frequency Ordered Stop   01/04/21 1900  ceFAZolin (ANCEF) IVPB 1 g/50 mL premix        1 g 100 mL/hr over 30 Minutes Intravenous Every 6 hours 01/04/21 1705 01/05/21 0109   01/04/21 1321  ceFAZolin (ANCEF) IVPB 2g/100 mL premix        2 g 200 mL/hr over 30 Minutes Intravenous 30 min pre-op  01/04/21 1322 01/04/21 1350       Objective   Vitals:   01/06/21 1136 01/06/21 1526 01/06/21 1959 01/07/21 0358  BP: (!) 144/78 (!) 161/78 (!) 153/85 138/74  Pulse: 71 100 94 74  Resp: 18 18 18 16   Temp: 98.4 F (36.9 C) 99 F (37.2 C) 98.2 F (36.8 C) 98.1 F (36.7 C)  TempSrc:      SpO2: 100% 95% 95% 96%  Weight:      Height:        Intake/Output Summary (Last 24 hours) at 01/07/2021 0658 Last data filed at 01/07/2021 14/08/2020 Gross per 24 hour  Intake --  Output 1905 ml  Net -1905 ml   Filed Weights   01/03/21 2133  Weight: 54 kg    Patient BMI: Body mass index is 22.12 kg/m.   Physical Exam: General: awake, alert, NAD HEENT: atraumatic, clear conjunctiva, anicteric sclera, ***moist mucus membranes, hearing grossly normal Respiratory: normal respiratory effort. Cardiovascular: normal S1/S2, *** RRR, no JVD, murmurs, rubs, gallops, ***quick capillary refill  Gastrointestinal: soft, NT, ND, no HSM felt Nervous: A&O x3***. no gross focal neurologic deficits, normal speech Extremities: moves all equally***, no*** edema, normal tone Skin: dry, intact, normal temperature, normal color, ***No rashes, lesions or ulcers Psychiatry: normal mood, congruent affect  Labs  I have personally reviewed following labs and imaging studies Admission on 01/03/2021  Component Date Value Ref Range Status   WBC 01/03/2021 9.0  4.0 - 10.5 K/uL Final   RBC 01/03/2021 3.81 (L)  3.87 - 5.11 MIL/uL Final   Hemoglobin 01/03/2021 12.2  12.0 - 15.0 g/dL Final   HCT 83/10/4074 35.5 (L)  36.0 - 46.0 % Final   MCV 01/03/2021 93.2  80.0 - 100.0 fL Final   MCH 01/03/2021 32.0  26.0 - 34.0 pg Final   MCHC 01/03/2021 34.4  30.0 - 36.0 g/dL Final   RDW 80/88/1103 12.1  11.5 - 15.5 % Final   Platelets 01/03/2021 360  150 - 400 K/uL Final   nRBC 01/03/2021 0.0  0.0 - 0.2 % Final   Sodium 01/03/2021 133 (L)  135 - 145 mmol/L Final   Potassium 01/03/2021 3.7  3.5 - 5.1 mmol/L Final   Chloride  01/03/2021 99  98 - 111 mmol/L Final   CO2 01/03/2021 25  22 - 32 mmol/L Final   Glucose, Bld 01/03/2021 106 (H)  70 - 99 mg/dL Final   BUN 15/94/5859 14  6 - 20 mg/dL Final   Creatinine, Ser 01/03/2021 0.85  0.44 - 1.00 mg/dL Final   Calcium 29/24/4628 9.0  8.9 - 10.3 mg/dL Final   Total Protein 63/81/7711 7.1  6.5 - 8.1 g/dL Final   Albumin 65/79/0383 4.2  3.5 - 5.0 g/dL Final   AST 33/83/2919 95 (H)  15 - 41 U/L Final   ALT 01/03/2021 119 (H)  0 - 44 U/L Final   Alkaline Phosphatase 01/03/2021 318 (H)  38 - 126 U/L Final   Total Bilirubin 01/03/2021 0.5  0.3 - 1.2 mg/dL Final   GFR, Estimated 01/03/2021 >60  >60 mL/min Final   Anion gap 01/03/2021 9  5 - 15 Final   Alcohol, Ethyl (B) 01/03/2021 308 (HH)  <10 mg/dL Final   SARS Coronavirus 2 by RT PCR 01/03/2021 NEGATIVE  NEGATIVE Final   Influenza A by PCR 01/03/2021 NEGATIVE  NEGATIVE Final   Influenza B by PCR 01/03/2021 NEGATIVE  NEGATIVE Final   ABO/RH(D) 01/03/2021 O POS   Final   Antibody Screen 01/03/2021 NEG   Final   Sample Expiration 01/03/2021    Final                   Value:01/06/2021,2359 Performed at [facility], [address] [place], [place] [address]    aPTT 01/04/2021 28  24 - 36 seconds Final   Prothrombin Time 01/04/2021 12.3  11.4 - 15.2 seconds Final   INR 01/04/2021 0.9  0.8 - 1.2 Final   HIV Screen 4th Generation wRfx 01/04/2021 Non Reactive  Non Reactive Final   Sodium 01/04/2021 138  135 - 145 mmol/L Final   Potassium 01/04/2021 3.7  3.5 - 5.1 mmol/L Final   Chloride 01/04/2021 107  98 - 111 mmol/L Final   CO2 01/04/2021 24  22 - 32 mmol/L Final   Glucose, Bld 01/04/2021 94  70 - 99 mg/dL Final   BUN 00/45/9977 12  6 - 20 mg/dL Final   Creatinine, Ser 01/04/2021 0.63  0.44 - 1.00 mg/dL Final   Calcium 41/42/3953 8.2 (L)  8.9 - 10.3 mg/dL Final   GFR, Estimated 01/04/2021 >60  >60 mL/min Final   Anion gap 01/04/2021 7  5 - 15 Final   WBC 01/04/2021 7.6  4.0 - 10.5 K/uL Final   RBC  01/04/2021 3.24 (L)  3.87 - 5.11 MIL/uL Final   Hemoglobin 01/04/2021 10.3 (L)  12.0 - 15.0 g/dL Final   HCT 31/54/0086 30.5 (L)  36.0 - 46.0 % Final   MCV 01/04/2021 94.1  80.0 - 100.0 fL Final   MCH 01/04/2021 31.8  26.0 - 34.0 pg Final   MCHC 01/04/2021 33.8  30.0 - 36.0 g/dL Final   RDW 76/19/5093 12.1  11.5 - 15.5 % Final   Platelets 01/04/2021 308  150 - 400 K/uL Final   nRBC 01/04/2021 0.0  0.0 - 0.2 % Final   WBC 01/04/2021 12.4 (H)  4.0 - 10.5 K/uL Final   RBC 01/04/2021 3.54 (L)  3.87 - 5.11 MIL/uL Final   Hemoglobin 01/04/2021 11.7 (L)  12.0 - 15.0 g/dL Final   HCT 26/71/2458 33.9 (L)  36.0 - 46.0 % Final   MCV 01/04/2021 95.8  80.0 - 100.0 fL Final   MCH 01/04/2021 33.1  26.0 - 34.0 pg Final   MCHC 01/04/2021 34.5  30.0 - 36.0 g/dL Final   RDW 09/98/3382 12.2  11.5 - 15.5 % Final   Platelets 01/04/2021 315  150 - 400 K/uL Final   nRBC 01/04/2021 0.0  0.0 - 0.2 % Final   Creatinine, Ser 01/04/2021 0.80  0.44 - 1.00 mg/dL Final   GFR, Estimated 01/04/2021 >60  >60 mL/min Final   WBC 01/05/2021 14.8 (H)  4.0 - 10.5 K/uL Final   RBC 01/05/2021 3.36 (L)  3.87 - 5.11 MIL/uL Final   Hemoglobin 01/05/2021 11.1 (L)  12.0 - 15.0 g/dL Final   HCT 50/53/9767 31.8 (L)  36.0 - 46.0 % Final   MCV 01/05/2021 94.6  80.0 - 100.0 fL Final   MCH 01/05/2021 33.0  26.0 - 34.0 pg Final   MCHC 01/05/2021 34.9  30.0 - 36.0 g/dL Final   RDW 34/19/3790 12.0  11.5 - 15.5 % Final   Platelets 01/05/2021 333  150 - 400 K/uL Final   nRBC 01/05/2021 0.0  0.0 - 0.2 % Final   Sodium 01/05/2021 137  135 - 145 mmol/L Final   Potassium 01/05/2021 4.6  3.5 - 5.1 mmol/L Final   Chloride 01/05/2021 105  98 - 111 mmol/L Final   CO2 01/05/2021 24  22 - 32 mmol/L Final   Glucose, Bld 01/05/2021 142 (H)  70 - 99 mg/dL Final   BUN 24/10/7351 10  6 - 20 mg/dL Final   Creatinine, Ser 01/05/2021 0.73  0.44 - 1.00 mg/dL Final   Calcium 29/92/4268 8.9  8.9 - 10.3 mg/dL Final   GFR, Estimated 01/05/2021 >60  >60  mL/min Final   Anion gap 01/05/2021 8  5 - 15 Final    Imaging Studies  DG Knee 1-2 Views Right  Result Date: 01/06/2021 CLINICAL DATA:  Postoperative status. EXAM: RIGHT KNEE - 1-2 VIEW COMPARISON:  January 04, 2021. FINDINGS: There is been significant reduction involving comminuted and displaced patellar fracture compared to prior exam. Surgical staples are noted anteriorly. IMPRESSION: Significant reduction of comminuted and displaced patellar fracture compared to prior exam. Electronically Signed   By: Lupita Raider M.D.   On: 01/06/2021 13:18   Medications   Scheduled Meds:  citalopram  40 mg Oral Daily   docusate sodium  100 mg Oral BID   enoxaparin (LOVENOX) injection  40 mg Subcutaneous Q24H   scopolamine  1 patch Transdermal Q72H   traZODone  50 mg Oral QHS   No recently discontinued medications to reconcile  LOS: 4 days  Time spent: >53min  Leeroy Bock, DO [facility] 01/07/2021, 6:58 AM   Please refer to amion to contact the [facility] Attending or Consulting provider for this pt  [email or web address] Available by Epic secure chat 7AM-7PM. If 7PM-7AM, please contact night-coverage

## 2021-01-07 NOTE — Plan of Care (Signed)

## 2021-01-07 NOTE — Progress Notes (Addendum)
  Subjective:  POD #3 s/p ORIF of right patella fracture.   Patient reports right knee pain as moderate and increases with physical therapy.  Patient continues to use her hinged knee brace locked in extension.  Objective:   VITALS:   Vitals:   01/06/21 1959 01/07/21 0358 01/07/21 0746 01/07/21 1124  BP: (!) 153/85 138/74 (!) 142/75 131/70  Pulse: 94 74 81 89  Resp: 18 16 16 15   Temp: 98.2 F (36.8 C) 98.1 F (36.7 C) 98.2 F (36.8 C) 98 F (36.7 C)  TempSrc:      SpO2: 95% 96% 99% 93%  Weight:      Height:        PHYSICAL EXAM: Right lower extremity Neurovascular intact Sensation intact distally Intact pulses distally Dorsiflexion/Plantar flexion intact Incision: dressing C/D/I No cellulitis present Compartment soft  LABS  No results found for this or any previous visit (from the past 24 hour(s)).  DG Knee 1-2 Views Right  Result Date: 01/06/2021 CLINICAL DATA:  Postoperative status. EXAM: RIGHT KNEE - 1-2 VIEW COMPARISON:  January 04, 2021. FINDINGS: There is been significant reduction involving comminuted and displaced patellar fracture compared to prior exam. Surgical staples are noted anteriorly. IMPRESSION: Significant reduction of comminuted and displaced patellar fracture compared to prior exam. Electronically Signed   By: January 06, 2021 M.D.   On: 01/06/2021 13:18    Assessment/Plan: 3 Days Post-Op   Principal Problem:   Right patella fracture Active Problems:   Alcoholic intoxication without complication (HCC)   Patellar tendon rupture, left, initial encounter  Spoke with 14/07/2020, PT regarding patient's progress.  He explains the patient is able to independently transfer and has been able to ambulate without weightbearing on the right lower extremity using a hemiwalker.  Patient will need a light wheelchair with a right leg support at home.  He is planning on working with the patient on stairs this afternoon.  The plan will be for discharge home with  home health physical therapy.  Case management is helping to set up therapy which will likely start in approximately 7 to 8 days.  Patient will follow-up in the office in 10 to 14 days for reevaluation and staple removal.  She is to remain toe-touch weightbearing only on the right lower extremity.  I explained this to the patient today at the bedside.  Patient will need a wheelchair with a right leg rest at home before being discharged.      Alvera Novel , MD 01/07/2021, 2:23 PM

## 2021-01-07 NOTE — Discharge Summary (Signed)
Physician Discharge Summary  Wanda Price:[id number] DOB: 18-Nov-1965 DOA: 01/03/2021  PCP: Ezekiel Slocumb, MD  Admit date: 01/03/2021 Discharge date: 01/08/2021  Admitted From: Home Disposition: Home  Recommendations for Outpatient Follow-up:  Follow up with PCP within 1-2 weeks to monitor alcohol cessation Follow up with ortho for patellar fracture in 10-14 days Follow up with oncology within 1-2 weeks for continued breast cancer treatment.  Physical therapy Equipment/Devices:wheelchair  Discharge Condition:stable CODE STATUS:  Code Status: Prior  Regular healthy diet  Brief/Interim Summary: Pt was admitted with acute R closed, displaced and comminuted patellar fracture and tendon rupture from a fall. Ortho surgery was consulted and she underwent surgery 12/4. The surgery went without complications. Because of her recent mastectomy and breast reconstruction in combination of this injury, she was non-weight bearing of her R leg and arm. She received PT/OT while inpatient and progressed well. She continued to have pain our of proportion for her surgery which delayed her discharge but was better managed prior to discharge. She was also feeling overwhelming feelings of anxiety and depression with considerable amount of medical problems occurring simultaneously.  She was discharged with home PT/OT and equipment in place. She will need to follow up with oncology and ortho surgery in the upcoming weeks.  I would recommend PCP follow up for depression/acute grief as well as alcohol use cessation.   Discharge Diagnoses:  Principal Problem:   Right patella fracture Active Problems:   Alcoholic intoxication without complication (HCC)   Patellar tendon rupture, left, initial encounter    Allergies as of 01/07/2021       Reactions   Desvenlafaxine Shortness Of Breath   Abilify [aripiprazole] Other (See Comments)   Facial Disturbance   Codeine    Macrobid [nitrofurantoin  Monohyd Macro] Other (See Comments)   Severe headache and nausea   Wellbutrin [bupropion] Other (See Comments)   Mood Changes   Zoloft [sertraline Hcl] Other (See Comments)   Wired, unable to sleep   Bacitracin Rash   Neomycin Rash   Polymyxin B Rash        Medication List     STOP taking these medications    gabapentin 100 MG capsule Commonly known as: NEURONTIN       TAKE these medications    acetaminophen 325 MG tablet Commonly known as: TYLENOL Take 2 tablets (650 mg total) by mouth every 6 (six) hours as needed for mild pain (or Fever >/= 101).   citalopram 40 MG tablet Commonly known as: CELEXA Take 40 mg by mouth daily.   cyclobenzaprine 5 MG tablet Commonly known as: FLEXERIL Take 5 mg by mouth 3 (three) times daily as needed.   HYDROmorphone 2 MG tablet Commonly known as: Dilaudid Take 0.5 tablets (1 mg total) by mouth every 6 (six) hours as needed for up to 5 days for severe pain.   ibuprofen 400 MG tablet Commonly known as: ADVIL Take 1 tablet (400 mg total) by mouth every 6 (six) hours as needed for moderate pain.   ondansetron 4 MG tablet Commonly known as: Zofran Take 1 tablet (4 mg total) by mouth 2 (two) times daily as needed for up to 5 days for nausea or vomiting.   traZODone 50 MG tablet Commonly known as: DESYREL Take 1 tablet (50 mg total) by mouth at bedtime.        Allergies  Allergen Reactions   Desvenlafaxine Shortness Of Breath   Abilify [Aripiprazole] Other (See Comments)    Facial Disturbance  Codeine    Macrobid [Nitrofurantoin Monohyd Macro] Other (See Comments)    Severe headache and nausea   Wellbutrin [Bupropion] Other (See Comments)    Mood Changes   Zoloft [Sertraline Hcl] Other (See Comments)    Wired, unable to sleep   Bacitracin Rash   Neomycin Rash   Polymyxin B Rash    Consultations: Ortho surgery  Procedures/Studies: DG Knee 1-2 Views Right  Result Date: 01/06/2021 CLINICAL DATA:  Postoperative  status. EXAM: RIGHT KNEE - 1-2 VIEW COMPARISON:  January 04, 2021. FINDINGS: There is been significant reduction involving comminuted and displaced patellar fracture compared to prior exam. Surgical staples are noted anteriorly. IMPRESSION: Significant reduction of comminuted and displaced patellar fracture compared to prior exam. Electronically Signed   By: Lupita Raider M.D.   On: 01/06/2021 13:18   DG Knee 1-2 Views Right  Result Date: 01/04/2021 CLINICAL DATA:  Right patellar fracture. EXAM: RIGHT KNEE - 1-2 VIEW COMPARISON:  Preoperative right knee views 01/03/2021. FINDINGS: Fluoroscopy time 33 seconds. Dosing information not available. Twelve sequential intraoperative spot fluoroscopic images are provided. Open reduction of the previously described comminuted patellar fracture was performed without the use of metal hardware. Grossly near anatomic alignment is seen on the final images. IMPRESSION: Grossly near anatomic post fracture reduction alignment on the spot fluoroscopic limited images. Electronically Signed   By: Almira Bar M.D.   On: 01/04/2021 20:47   CT KNEE RIGHT WO CONTRAST  Result Date: 01/04/2021 CLINICAL DATA:  RIGHT knee fracture last night. Preoperative imaging. EXAM: CT OF THE RIGHT KNEE WITHOUT CONTRAST TECHNIQUE: Multidetector CT imaging of the RIGHT knee was performed according to the standard protocol. Multiplanar CT image reconstructions were also generated. COMPARISON:  Plain film of the RIGHT knee dated 01/03/2021. FINDINGS: Significantly displaced fracture of the RIGHT patella. The main fracture fragments are displaced approximately 5 cm. Lower pole fragment contains a comminuted fracture. No medial or lateral patellar displacement to suggest concomitant retinaculum tear. No additional fracture line identified within the distal femur, proximal tibia or proximal fibula. Anterior and posterior cruciate ligaments appear grossly intact. Small joint effusion. IMPRESSION: 1.  Significantly displaced fracture of the RIGHT patella. The main fracture fragments are displaced approximately 5 cm. Lower pole fragment contains slightly displaced comminuted fracture lines. 2. No medial or lateral patellar displacement to suggest concomitant retinaculum tear. 3. Anterior and posterior cruciate ligaments appear intact and normal in alignment. 4. Small joint effusion. Electronically Signed   By: Bary Richard M.D.   On: 01/04/2021 10:45   DG Knee Complete 4 Views Right  Result Date: 01/03/2021 CLINICAL DATA:  Recent fall EXAM: RIGHT KNEE - COMPLETE 4+ VIEW COMPARISON:  None. FINDINGS: There is a transverse mildly comminuted fracture through the mid to lower portion of the patella. The fracture fragments are distracted by proximally 5.3 cm. No sizable joint effusion is seen. No other fracture or dislocation is noted. IMPRESSION: Comminuted mid to lower patellar fracture with distraction of the fracture fragments as described. Electronically Signed   By: Alcide Clever M.D.   On: 01/03/2021 22:19   DG Knee Right Port  Result Date: 01/04/2021 CLINICAL DATA:  Status post surgical pair of patellar fracture. EXAM: PORTABLE RIGHT KNEE - 1-2 VIEW COMPARISON:  January 03, 2021. FINDINGS: Postoperative change of open repair of the comminuted patellar fracture without evidence of acute complication. Cutaneous skin staples. Subcutaneous gas and edema. External knee brace is in place. IMPRESSION: Postoperative change of open repair of the comminuted patellar  fracture without evidence of acute complication. Electronically Signed   By: Maudry Mayhew M.D.   On: 01/04/2021 17:54   DG C-Arm 1-60 Min-No Report  Result Date: 01/04/2021 Fluoroscopy was utilized by the requesting physician.  No radiographic interpretation.    Subjective: Patient feels overwhelmed with several medical problems occurring simultaneously. She has continued pain in her right knee which is moderately well controlled with pain  medication. She is worried about being able to care for herself at home but endorses a good support network of family and friends.   Discharge Exam: Vitals:   01/07/21 1124 01/07/21 1611  BP: 131/70 (!) 149/73  Pulse: 89 80  Resp: 15 15  Temp: 98 F (36.7 C)   SpO2: 93% 100%    General: Pt is alert, awake laying in bed Cardiovascular: RRR, S1/S2 +, no rubs, no gallops Respiratory: CTA bilaterally, no wheezing, no rhonchi Psych: tearful.  Extremities: no edema, no cyanosis. R knee in brace. Neurovascularly intact distally  Labs: Basic Metabolic Panel: Recent Labs  Lab 01/03/21 2141 01/04/21 0611 01/04/21 1810 01/05/21 0454  NA 133* 138  --  137  K 3.7 3.7  --  4.6  CL 99 107  --  105  CO2 25 24  --  24  GLUCOSE 106* 94  --  142*  BUN 14 12  --  10  CREATININE 0.85 0.63 0.80 0.73  CALCIUM 9.0 8.2*  --  8.9   CBC: Recent Labs  Lab 01/03/21 2141 01/04/21 0611 01/04/21 1810 01/05/21 0454  WBC 9.0 7.6 12.4* 14.8*  HGB 12.2 10.3* 11.7* 11.1*  HCT 35.5* 30.5* 33.9* 31.8*  MCV 93.2 94.1 95.8 94.6  PLT 360 308 315 333    Microbiology Recent Results (from the past 240 hour(s))  Resp Panel by RT-PCR (Flu A&B, Covid) Nasopharyngeal Swab     Status: None   Collection Time: 01/03/21 10:16 PM   Specimen: Nasopharyngeal Swab; Nasopharyngeal(NP) swabs in vial transport medium  Result Value Ref Range Status   SARS Coronavirus 2 by RT PCR NEGATIVE NEGATIVE Final    Comment: (NOTE) SARS-CoV-2 target nucleic acids are NOT DETECTED.  The SARS-CoV-2 RNA is generally detectable in upper respiratory specimens during the acute phase of infection. The lowest concentration of SARS-CoV-2 viral copies this assay can detect is 138 copies/mL. A negative result does not preclude SARS-Cov-2 infection and should not be used as the sole basis for treatment or other patient management decisions. A negative result may occur with  improper specimen collection/handling, submission of  specimen other than nasopharyngeal swab, presence of viral mutation(s) within the areas targeted by this assay, and inadequate number of viral copies(<138 copies/mL). A negative result must be combined with clinical observations, patient history, and epidemiological information. The expected result is Negative.  Fact Sheet for Patients:  BloggerCourse.com  Fact Sheet for Healthcare Providers:  SeriousBroker.it  This test is no t yet approved or cleared by the [place] FDA and  has been authorized for detection and/or diagnosis of SARS-CoV-2 by FDA under an Emergency Use Authorization (EUA). This EUA will remain  in effect (meaning this test can be used) for the duration of the COVID-19 declaration under Section 564(b)(1) of the Act, 21 U.S.C.section 360bbb-3(b)(1), unless the authorization is terminated  or revoked sooner.       Influenza A by PCR NEGATIVE NEGATIVE Final   Influenza B by PCR NEGATIVE NEGATIVE Final    Comment: (NOTE) The Xpert Xpress SARS-CoV-2/FLU/RSV plus assay is intended  as an aid in the diagnosis of influenza from Nasopharyngeal swab specimens and should not be used as a sole basis for treatment. Nasal washings and aspirates are unacceptable for Xpert Xpress SARS-CoV-2/FLU/RSV testing.  Fact Sheet for Patients: BloggerCourse.com  Fact Sheet for Healthcare Providers: SeriousBroker.it  This test is not yet approved or cleared by the [place] FDA and has been authorized for detection and/or diagnosis of SARS-CoV-2 by FDA under an Emergency Use Authorization (EUA). This EUA will remain in effect (meaning this test can be used) for the duration of the COVID-19 declaration under Section 564(b)(1) of the Act, 21 U.S.C. section 360bbb-3(b)(1), unless the authorization is terminated or revoked.  Performed at [facility], [address] [place], [place] [address]     Time coordinating discharge: Over 30 minutes  Leeroy Bock, MD  [facility] 01/08/2021, 12:31 PM Pager   If 7PM-7AM, please contact night-coverage [email or web address] Password [email or web address]

## 2021-01-07 NOTE — TOC Progression Note (Signed)
Transition of Care [facility]) - Progression Note    Patient Details  Name: LEAHMARIE GASIOROWSKI MRN: [id number] Date of Birth: November 30, 1965  Transition [facility] CM/SW Contact  Caryn Section, RN Phone Number: 01/07/2021, 3:16 PM  Clinical Narrative:   Patient discharging home with home health [facility] will accept as per sarah at agency, who confirms that they will see her tomorrow.Wheelchair with leg rest, 3 n 1 ordered with [employer] DME to be delivered to patient room.    Expected Discharge Plan: Home w Home Health Services Barriers to Discharge: Continued Medical Work up  Expected Discharge Plan and Services Expected Discharge Plan: Home w Home Health Services   Discharge Planning Services: CM Consult Post Acute Care Choice: Durable Medical Equipment, Home Health Living arrangements for the past 2 months: Single Family Home Expected Discharge Date: 01/07/21                                     Social Determinants of Health (SDOH) Interventions    Readmission Risk Interventions No flowsheet data found.

## 2021-01-07 NOTE — Progress Notes (Signed)
Occupational Therapy Treatment Patient Details Name: Wanda Price MRN: [id number] DOB: 1965/09/25 Today's Date: 01/07/2021   History of present illness 55yo female sustained a closed displaced and comminuted fracture of the R patella after a fall. Now s/p ORIF,TTWB. PMHx includes anxiety, chronic UTI, depression, restless leg syndrome, breast cancer s/p bilateral mastectomy on 05/2020 with recent L breast reconstruction, LUE NWBing per surgeon.   OT comments  Ms. Mitchell was seen for OT treatment on this date. Pt received seated in WC in hall with PT finishing transfer education. Pt requires MIN A for propulsion of WC during functional mobility primarily to maintain LUE NWB status during session. Pt educated on WC safety. Upon return to room, she is able to squat pivot t/f toward R side from [facility] to room recliner with min cueing for safety (maintains LUE NWB and RLE TTWB t/o session). Pt educated on [facility] management strategies, safe use of AE/DME for ADL Management, polar care management strategies, and routines modifications to support safety and functional independence with ADL management. Pt return verbalized understanding of instruction provided. Pt making good progress toward goals. Pt continues to benefit from skilled OT services to maximize return to PLOF and minimize risk of future falls, injury, caregiver burden, and readmission. Will continue to follow POC. Discharge recommendation remains appropriate.     Recommendations for follow up therapy are one component of a multi-disciplinary discharge planning process, led by the attending physician.  Recommendations may be updated based on patient status, additional functional criteria and insurance authorization.    Follow Up Recommendations  Home health OT    Assistance Recommended at Discharge Intermittent Supervision/Assistance  Equipment Recommendations  BSC/3in1;Other (comment) (lightweight wheelchair with elevating leg rest for RLE)     Recommendations for Other Services      Precautions / Restrictions Precautions Precautions: Knee;Fall Required Braces or Orthoses: Knee Immobilizer - Right Knee Immobilizer - Right: On at all times Restrictions Weight Bearing Restrictions: Yes RUE Weight Bearing: Weight bearing as tolerated LUE Weight Bearing: Non weight bearing (5-10lbs max if needed per chart) RLE Weight Bearing: Weight bearing as tolerated       Mobility Bed Mobility Overal bed mobility: Needs Assistance Bed Mobility: Supine to Sit     Supine to sit: Modified independent (Device/Increase time)     General bed mobility comments: Deferred. Pt in WC/recliner at start/end of session.    Transfers Overall transfer level: Needs assistance Equipment used: Pushed w/c Transfers: Bed to chair/wheelchair/BSC (WC > recliner)       Squat pivot transfers: Supervision     General transfer comment: WC to room recliner toward R; utilized Rt foot to BOS, the RUE reach to far reclienr arm rest: squat pivot     Balance Overall balance assessment: Modified Independent   Sitting balance-Leahy Scale: Normal                                     ADL either performed or assessed with clinical judgement   ADL Overall ADL's : Needs assistance/impaired                                       General ADL Comments: Pt requires MIN A for propulsion of WC during functional mobility. She is able to squat pivot t/f toward R side from [facility] to room recliner  with min cueing for safety (maintains LUE NWB and RLE TTWB t/o session). Pt educated on [facility] management strategies, safe use of AE/DME for ADL Management, polar care management strategies, and routines modifications to support safety and functional independence with ADL management.    Extremity/Trunk Assessment Upper Extremity Assessment LUE Deficits / Details: NWBing per surgeon and shoulder flexion to 90 max   Lower Extremity Assessment RLE  Deficits / Details: s/p sx, must remain in knee extension brace at all times, TTWBing RLE: Unable to fully assess due to immobilization;Unable to fully assess due to pain        Vision Patient Visual Report: No change from baseline     Perception     Praxis      Cognition Arousal/Alertness: Awake/alert Behavior During Therapy: WFL for tasks assessed/performed Overall Cognitive Status: Within Functional Limits for tasks assessed                                            Exercises Other Exercises Other Exercises: OT faciltiates education on safe transfer techniques, falls prevention strategies, WB precautions, and routines modifications to support safety and functional indepenence with ADL management.   Shoulder Instructions       General Comments      Pertinent Vitals/ Pain       Pain Assessment: 0-10 Pain Score: 7  Pain Location: R knee Pain Descriptors / Indicators: Aching;Sore;Grimacing Pain Intervention(s): Limited activity within patient's tolerance;Monitored during session;Repositioned;Patient requesting pain meds-RN notified  Home Living                                          Prior Functioning/Environment              Frequency  Min 3X/week        Progress Toward Goals  OT Goals(current goals can now be found in the care plan section)  Progress towards OT goals: Progressing toward goals  Acute Rehab OT Goals Patient Stated Goal: To go home and recover OT Goal Formulation: With patient Time For Goal Achievement: 01/19/21 Potential to Achieve Goals: Good  Plan Discharge plan remains appropriate;Frequency remains appropriate    Co-evaluation                 AM-PAC OT "6 Clicks" Daily Activity     Outcome Measure   Help from another person eating meals?: None Help from another person taking care of personal grooming?: None Help from another person toileting, which includes using toliet, bedpan, or  urinal?: A Little Help from another person bathing (including washing, rinsing, drying)?: A Little Help from another person to put on and taking off regular upper body clothing?: None Help from another person to put on and taking off regular lower body clothing?: A Lot 6 Click Score: 20    End of Session Equipment Utilized During Treatment: Other (comment) (WC)  OT Visit Diagnosis: Other abnormalities of gait and mobility (R26.89);History of falling (Z91.81);Pain Pain - Right/Left: Right Pain - part of body: Knee   Activity Tolerance Patient tolerated treatment well   Patient Left in chair;with call bell/phone within reach;with chair alarm set   Nurse Communication Patient requests pain meds        Time: 2671-2458 OT Time Calculation (min): 18 min  Charges:  OT General Charges $OT Visit: 1 Visit OT Treatments $Self Care/Home Management : 8-22 mins  Rockney Ghee, M.S., OTR/L Feeding Team - [facility] Ascom: [phone] 01/07/21, 12:56 PM

## 2021-01-07 NOTE — Progress Notes (Signed)
Discharge instructions explained to the patient and the patient verbalized understanding. The patients IV was taken out. The patient has all of her equipment. The patient was wheeled to the medical mall by staff.

## 2021-04-10 ENCOUNTER — Encounter: Payer: Self-pay | Admitting: Orthopedic Surgery

## 2023-07-11 IMAGING — CT CT KNEE*R* W/O CM
4 of 6 series · 15 of 33 positions shown, 17 images · non-contrast
Comparison: Plain film of the RIGHT knee dated 01/03/2021.

CLINICAL DATA: RIGHT knee fracture last night. Preoperative
imaging.

EXAM:
CT OF THE RIGHT KNEE WITHOUT CONTRAST
TECHNIQUE: Multidetector CT imaging of the RIGHT knee was performed according
to the standard protocol. Multiplanar CT image reconstructions were
also generated.

[Series 4: axial bone · axial · 0.29mm/px · z∈[-1381,-1266]mm · 5 of 173 slices shown, 7 images]
[im 29/173  soft-tissue]
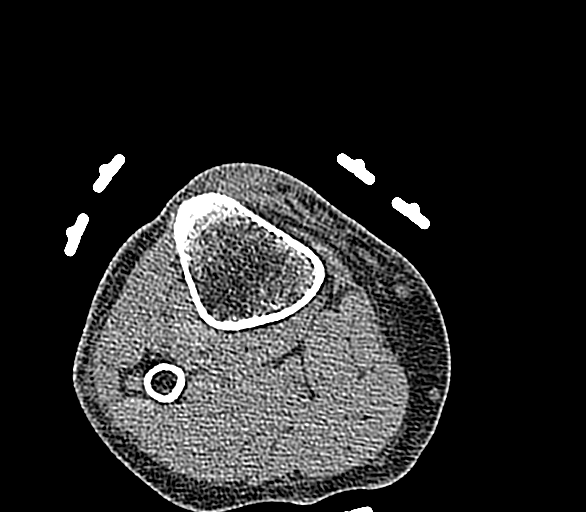
[im 29/173  bone]
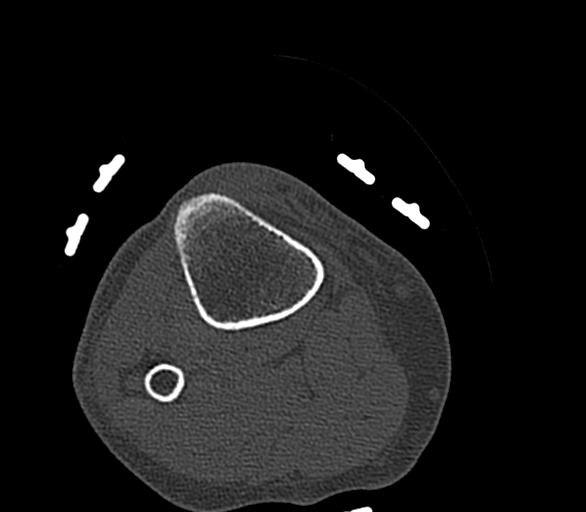
[im 58/173  bone]
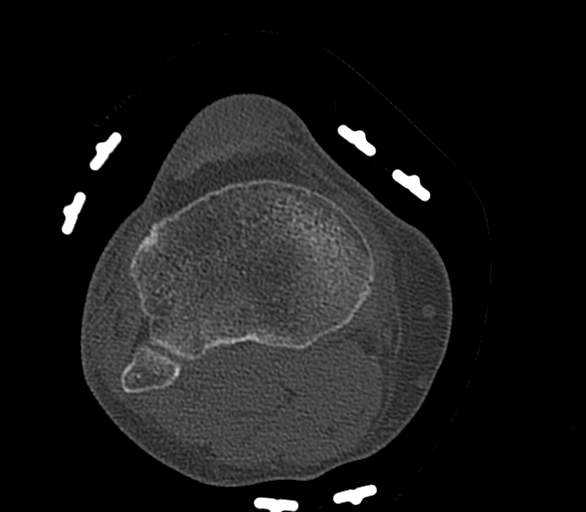
[im 87/173  bone]
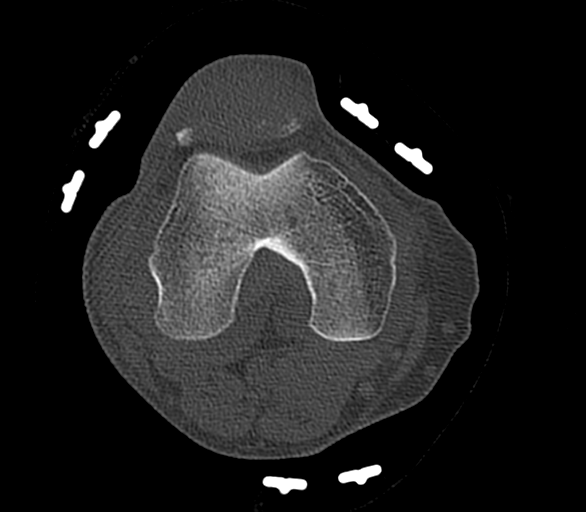
[im 115/173  bone]
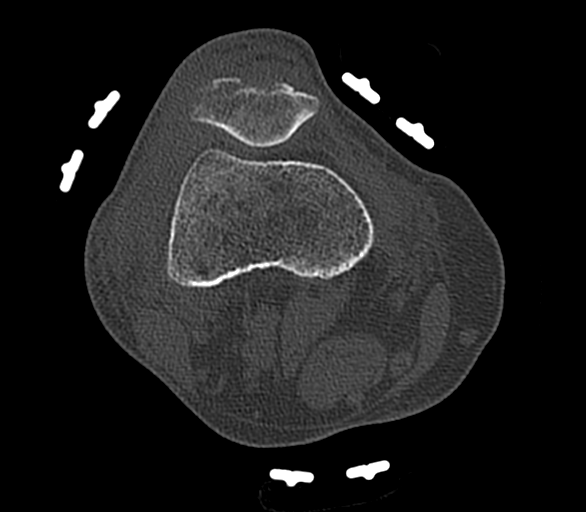
[im 144/173  soft-tissue]
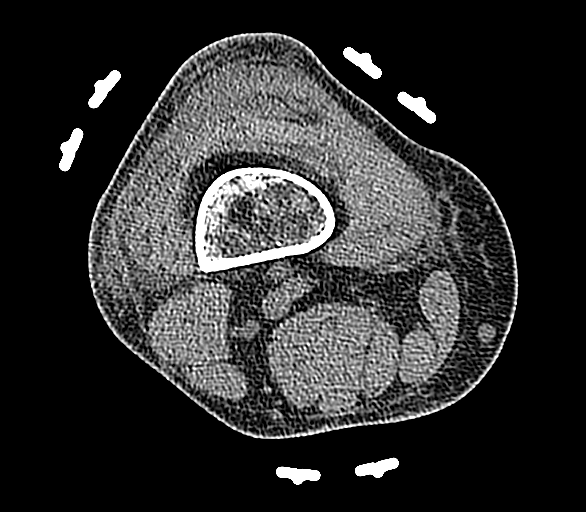
[im 144/173  bone]
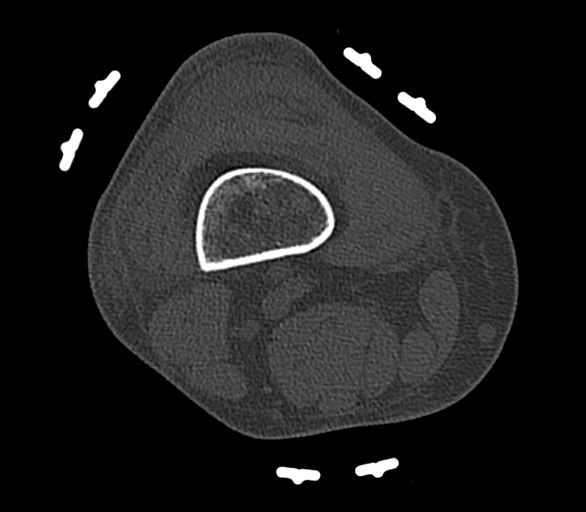

[Series 5: axial st · axial · 0.29mm/px · z∈[-1381,-1295]mm · 4 of 173 slices shown]
[im 29/173  bone]
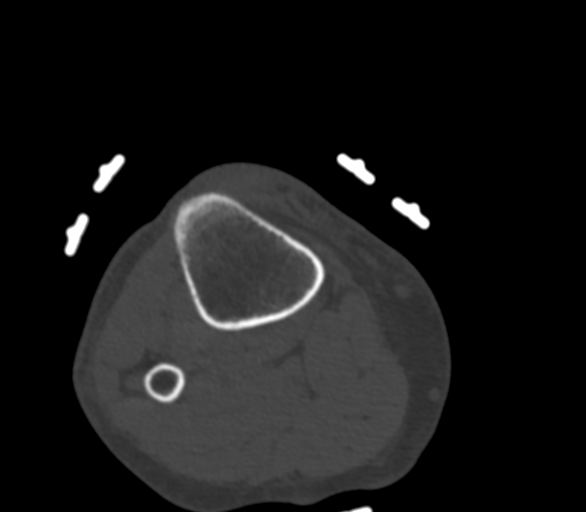
[im 58/173  bone]
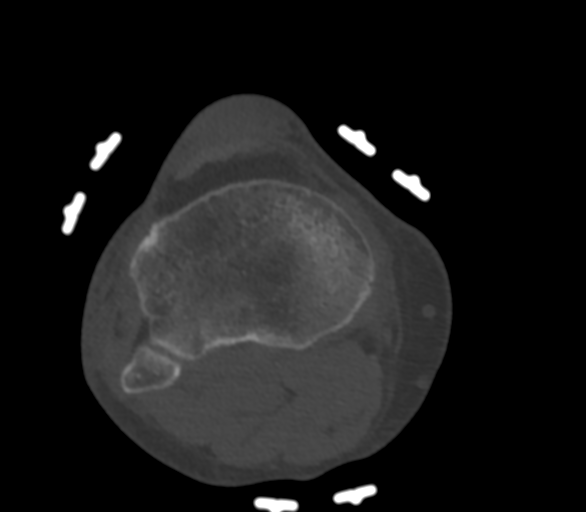
[im 87/173  bone]
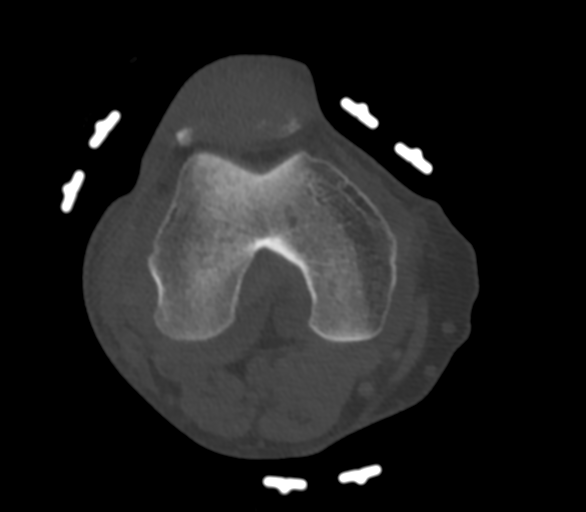
[im 115/173  bone]
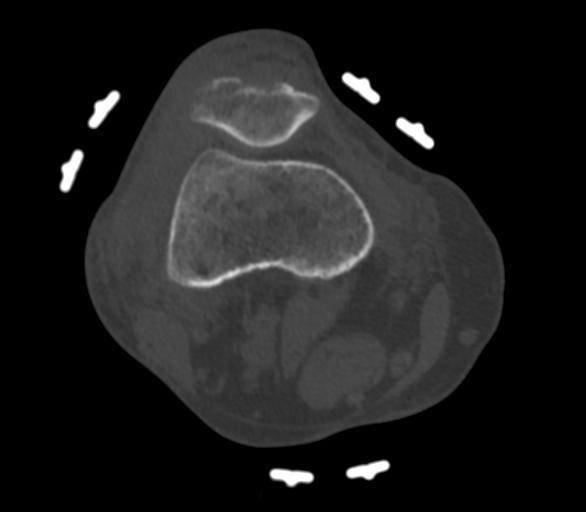

[Series 6: cor bone · coronal · 0.33mm/px · 1 of 147 slices shown]
[im 74/147  bone]
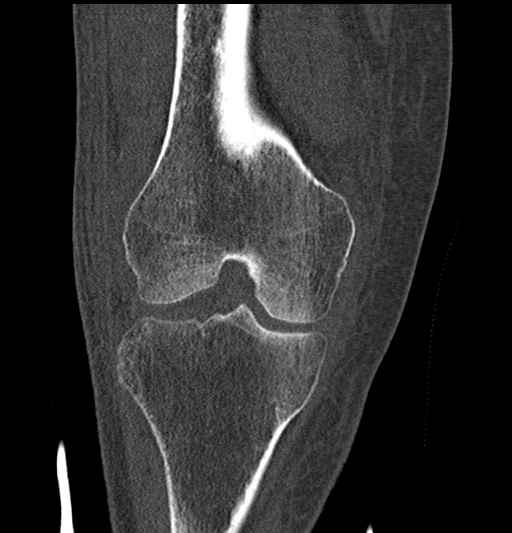

[Series 8: sag bone · sagittal · 0.29mm/px · 5 of 135 slices shown]
[im 23/135  bone]
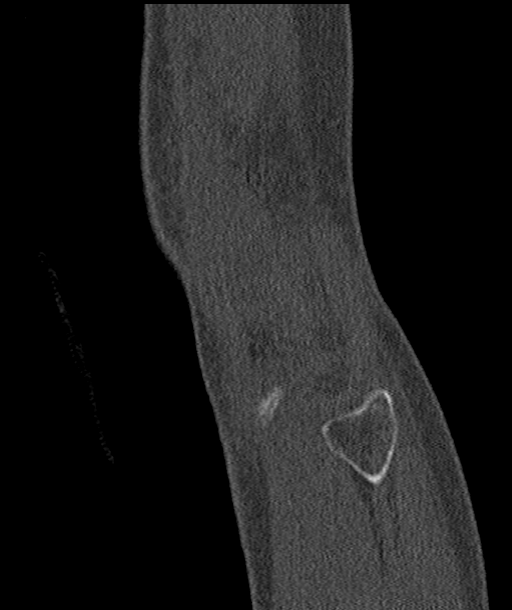
[im 45/135  bone]
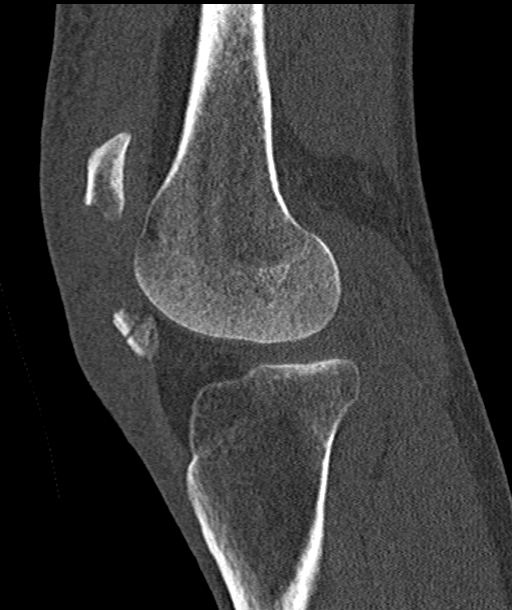
[im 68/135  bone]
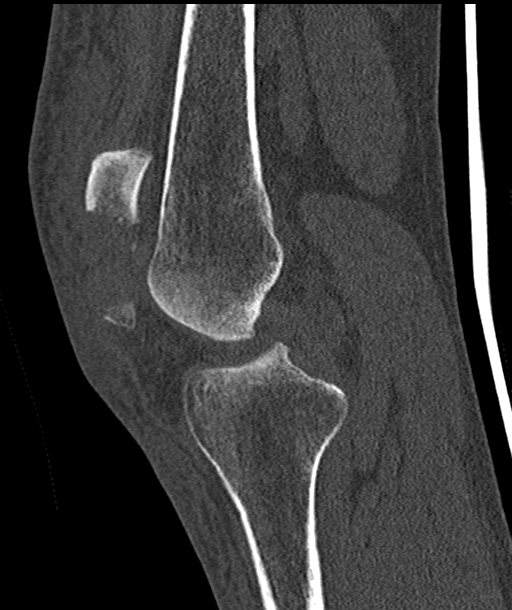
[im 90/135  bone]
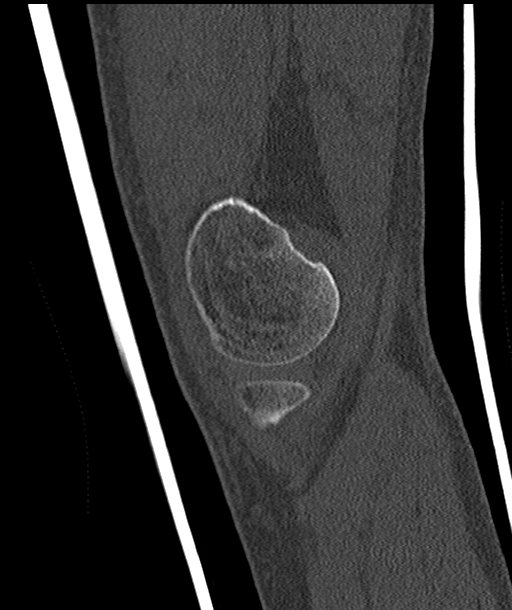
[im 112/135  bone]
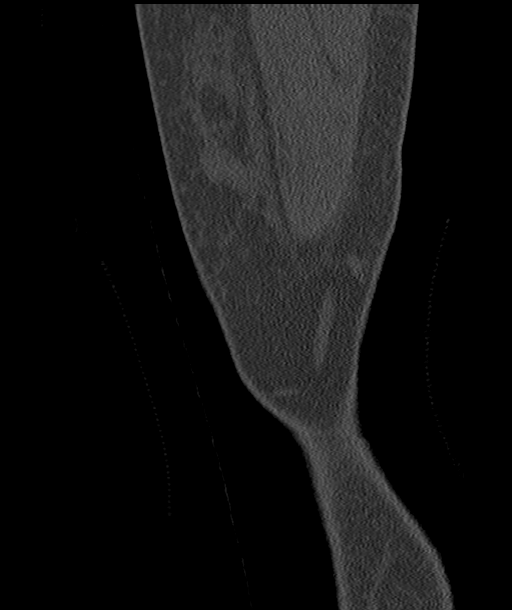

[15 of 33 positions shown; findings below may reference images not displayed]

FINDINGS: Significantly displaced fracture of the RIGHT patella. The main
fracture fragments are displaced approximately 5 cm. Lower pole
fragment contains a comminuted fracture. No medial or lateral
patellar displacement to suggest concomitant retinaculum tear.

No additional fracture line identified within the distal femur,
proximal tibia or proximal fibula. Anterior and posterior cruciate
ligaments appear grossly intact. Small joint effusion.
IMPRESSION: 1. Significantly displaced fracture of the RIGHT patella. The main
fracture fragments are displaced approximately 5 cm. Lower pole
fragment contains slightly displaced comminuted fracture lines.
2. No medial or lateral patellar displacement to suggest concomitant
retinaculum tear.
3. Anterior and posterior cruciate ligaments appear intact and
normal in alignment.
4. Small joint effusion.

## 2023-07-13 IMAGING — DX DG KNEE 1-2V*R*
2 series · 2 of 2 positions shown · non-contrast
Comparison: January 04, 2021.

CLINICAL DATA: Postoperative status.

EXAM:
RIGHT KNEE - 1-2 VIEW

[knee ap]
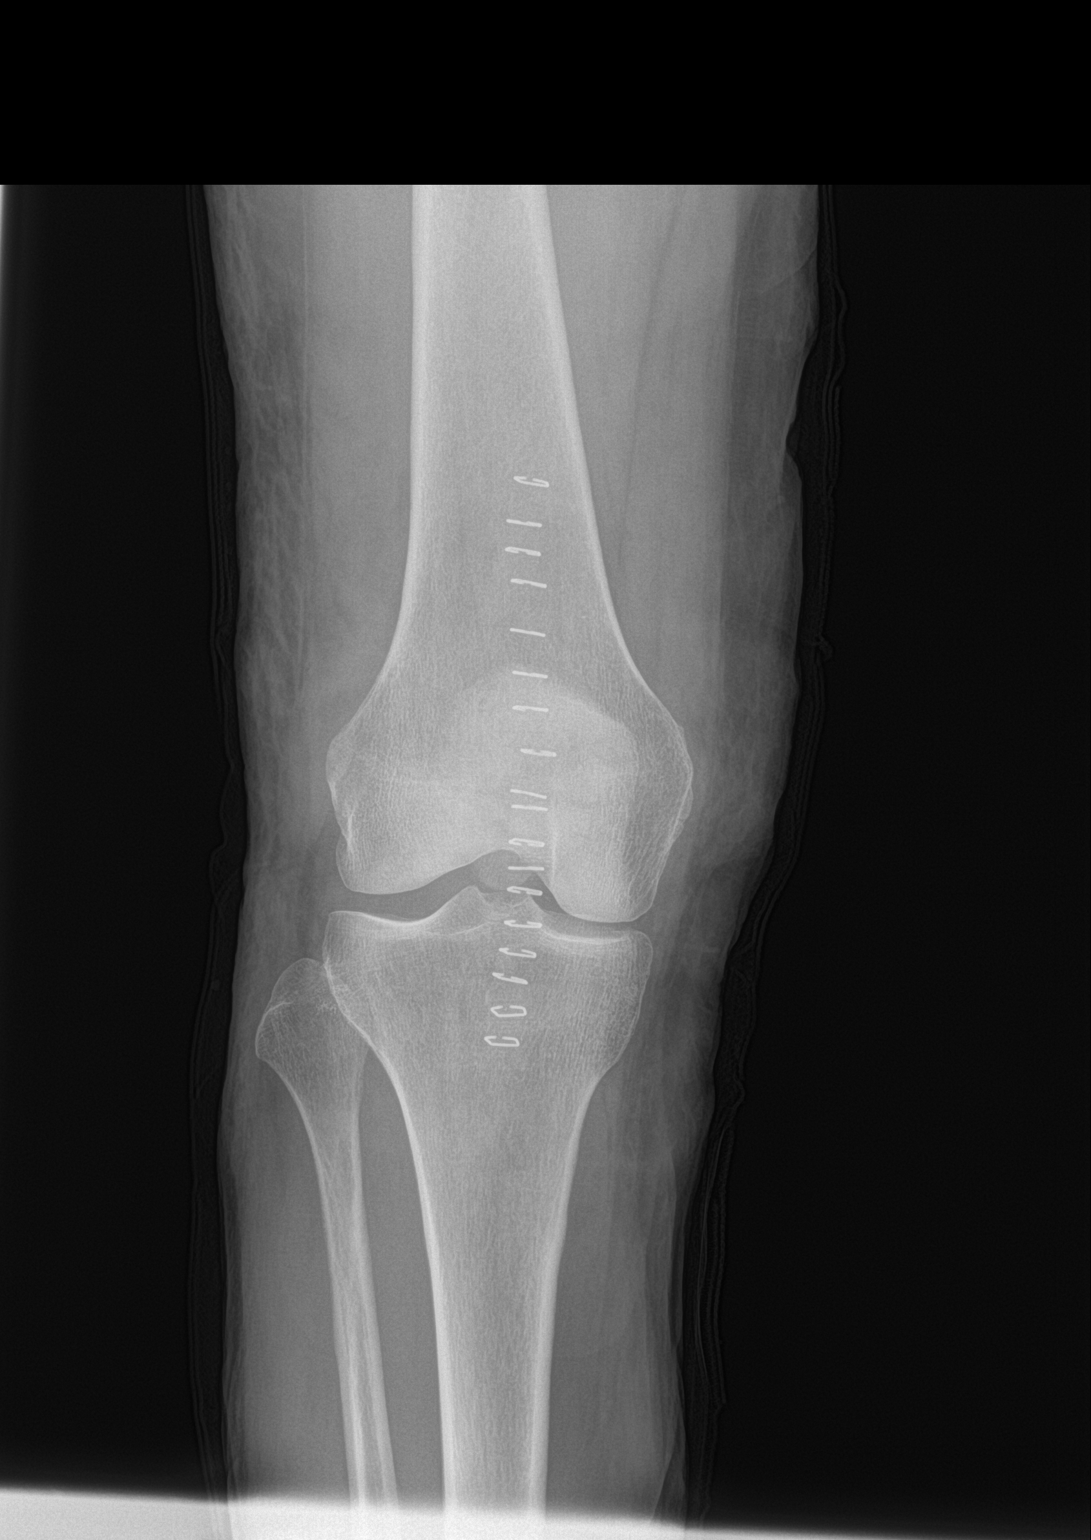

[knee lat]
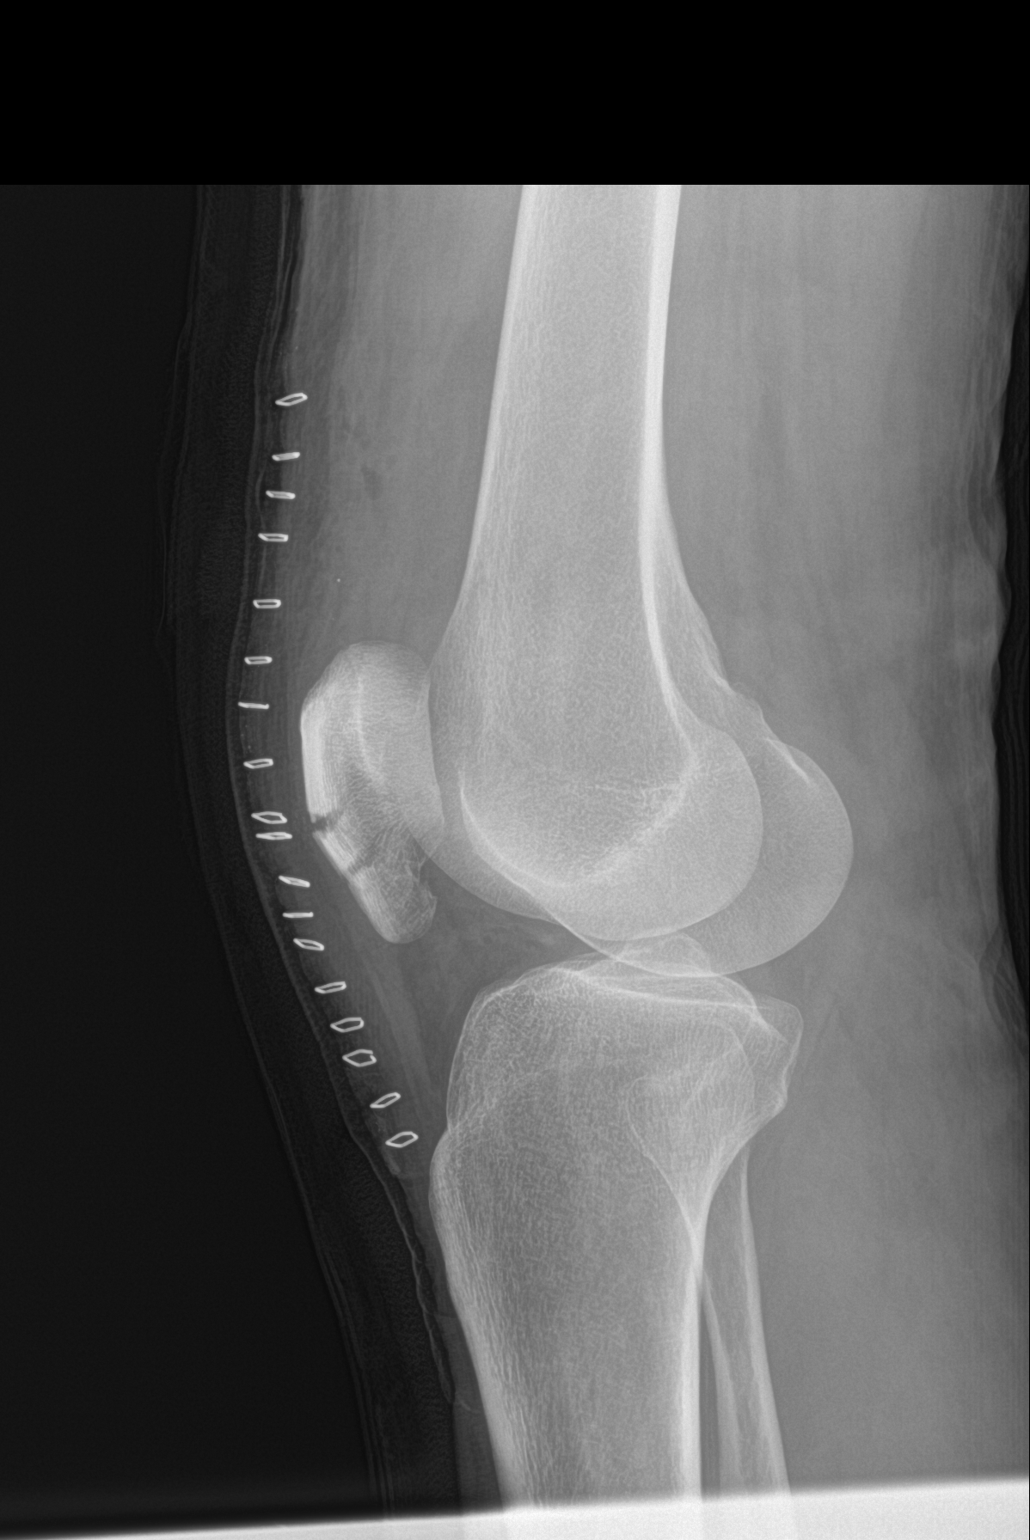

[2 of 2 positions shown; findings below may reference images not displayed]

FINDINGS: There is been significant reduction involving comminuted and
displaced patellar fracture compared to prior exam. Surgical staples
are noted anteriorly.
IMPRESSION: Significant reduction of comminuted and displaced patellar fracture
compared to prior exam.
